# Patient Record
Sex: Female | Born: 1960 | Race: White | Hispanic: No | Marital: Married | Smoking: Never smoker
Health system: Southern US, Community
[De-identification: ages and names within clinical notes are randomized; demographics above are authoritative.]

## PROBLEM LIST (undated history)

## (undated) DIAGNOSIS — K589 Irritable bowel syndrome without diarrhea: Secondary | ICD-10-CM

## (undated) DIAGNOSIS — Z5189 Encounter for other specified aftercare: Secondary | ICD-10-CM

## (undated) DIAGNOSIS — Z9889 Other specified postprocedural states: Secondary | ICD-10-CM

## (undated) DIAGNOSIS — J45909 Unspecified asthma, uncomplicated: Secondary | ICD-10-CM

## (undated) DIAGNOSIS — C8590 Non-Hodgkin lymphoma, unspecified, unspecified site: Secondary | ICD-10-CM

## (undated) DIAGNOSIS — R160 Hepatomegaly, not elsewhere classified: Secondary | ICD-10-CM

## (undated) DIAGNOSIS — E785 Hyperlipidemia, unspecified: Secondary | ICD-10-CM

## (undated) DIAGNOSIS — I10 Essential (primary) hypertension: Secondary | ICD-10-CM

## (undated) DIAGNOSIS — F32A Depression, unspecified: Secondary | ICD-10-CM

## (undated) DIAGNOSIS — K219 Gastro-esophageal reflux disease without esophagitis: Secondary | ICD-10-CM

## (undated) DIAGNOSIS — R112 Nausea with vomiting, unspecified: Secondary | ICD-10-CM

## (undated) DIAGNOSIS — F329 Major depressive disorder, single episode, unspecified: Secondary | ICD-10-CM

## (undated) HISTORY — DX: Non-Hodgkin lymphoma, unspecified, unspecified site: C85.90

## (undated) HISTORY — PX: CARPAL TUNNEL RELEASE: SHX101

## (undated) HISTORY — DX: Gastro-esophageal reflux disease without esophagitis: K21.9

## (undated) HISTORY — DX: Irritable bowel syndrome, unspecified: K58.9

## (undated) HISTORY — DX: Essential (primary) hypertension: I10

## (undated) HISTORY — DX: Hepatomegaly, not elsewhere classified: R16.0

## (undated) HISTORY — DX: Major depressive disorder, single episode, unspecified: F32.9

## (undated) HISTORY — DX: Encounter for other specified aftercare: Z51.89

## (undated) HISTORY — DX: Hyperlipidemia, unspecified: E78.5

## (undated) HISTORY — DX: Unspecified asthma, uncomplicated: J45.909

## (undated) HISTORY — DX: Nausea with vomiting, unspecified: R11.2

## (undated) HISTORY — DX: Other specified postprocedural states: Z98.890

## (undated) HISTORY — DX: Depression, unspecified: F32.A

## (undated) HISTORY — PX: APPENDECTOMY: SHX54

---

## 1984-08-20 HISTORY — PX: LAPAROSCOPIC SALPINGO OOPHERECTOMY: SHX5927

## 1996-08-20 DIAGNOSIS — C8590 Non-Hodgkin lymphoma, unspecified, unspecified site: Secondary | ICD-10-CM

## 1996-08-20 HISTORY — DX: Non-Hodgkin lymphoma, unspecified, unspecified site: C85.90

## 1996-08-20 HISTORY — PX: PORTA CATH INSERTION: CATH118285

## 1997-08-20 HISTORY — PX: BONE MARROW TRANSPLANT: SHX200

## 1997-11-26 ENCOUNTER — Encounter: Admission: RE | Admit: 1997-11-26 | Discharge: 1998-02-24 | Payer: Self-pay | Admitting: Hematology & Oncology

## 1998-03-07 ENCOUNTER — Ambulatory Visit (HOSPITAL_COMMUNITY): Admission: RE | Admit: 1998-03-07 | Discharge: 1998-03-07 | Payer: Self-pay | Admitting: Hematology & Oncology

## 1998-03-08 ENCOUNTER — Ambulatory Visit (HOSPITAL_COMMUNITY): Admission: RE | Admit: 1998-03-08 | Discharge: 1998-03-08 | Payer: Self-pay | Admitting: *Deleted

## 1998-03-08 ENCOUNTER — Ambulatory Visit (HOSPITAL_COMMUNITY): Admission: RE | Admit: 1998-03-08 | Discharge: 1998-03-08 | Payer: Self-pay | Admitting: Hematology & Oncology

## 1998-05-27 ENCOUNTER — Encounter: Payer: Self-pay | Admitting: Hematology & Oncology

## 1998-05-27 ENCOUNTER — Ambulatory Visit (HOSPITAL_COMMUNITY): Admission: RE | Admit: 1998-05-27 | Discharge: 1998-05-27 | Payer: Self-pay | Admitting: Hematology & Oncology

## 1998-08-25 ENCOUNTER — Encounter: Payer: Self-pay | Admitting: Hematology & Oncology

## 1998-08-25 ENCOUNTER — Ambulatory Visit (HOSPITAL_COMMUNITY): Admission: RE | Admit: 1998-08-25 | Discharge: 1998-08-25 | Payer: Self-pay | Admitting: Hematology & Oncology

## 1998-11-23 ENCOUNTER — Encounter: Payer: Self-pay | Admitting: Hematology & Oncology

## 1998-11-23 ENCOUNTER — Ambulatory Visit (HOSPITAL_COMMUNITY): Admission: RE | Admit: 1998-11-23 | Discharge: 1998-11-23 | Payer: Self-pay | Admitting: Hematology & Oncology

## 1999-06-07 ENCOUNTER — Ambulatory Visit (HOSPITAL_COMMUNITY): Admission: RE | Admit: 1999-06-07 | Discharge: 1999-06-07 | Payer: Self-pay | Admitting: Hematology & Oncology

## 1999-06-07 ENCOUNTER — Encounter: Payer: Self-pay | Admitting: Hematology & Oncology

## 1999-11-10 ENCOUNTER — Ambulatory Visit (HOSPITAL_BASED_OUTPATIENT_CLINIC_OR_DEPARTMENT_OTHER): Admission: RE | Admit: 1999-11-10 | Discharge: 1999-11-10 | Payer: Self-pay | Admitting: *Deleted

## 1999-11-27 ENCOUNTER — Ambulatory Visit (HOSPITAL_COMMUNITY): Admission: RE | Admit: 1999-11-27 | Discharge: 1999-11-27 | Payer: Self-pay | Admitting: Hematology & Oncology

## 1999-11-27 ENCOUNTER — Encounter: Payer: Self-pay | Admitting: Hematology & Oncology

## 1999-12-15 ENCOUNTER — Ambulatory Visit (HOSPITAL_BASED_OUTPATIENT_CLINIC_OR_DEPARTMENT_OTHER): Admission: RE | Admit: 1999-12-15 | Discharge: 1999-12-15 | Payer: Self-pay | Admitting: *Deleted

## 2000-04-29 ENCOUNTER — Ambulatory Visit (HOSPITAL_COMMUNITY): Admission: RE | Admit: 2000-04-29 | Discharge: 2000-04-29 | Payer: Self-pay | Admitting: Hematology & Oncology

## 2000-04-29 ENCOUNTER — Encounter: Payer: Self-pay | Admitting: Hematology & Oncology

## 2000-05-21 ENCOUNTER — Other Ambulatory Visit: Admission: RE | Admit: 2000-05-21 | Discharge: 2000-05-21 | Payer: Self-pay | Admitting: Obstetrics and Gynecology

## 2000-06-27 ENCOUNTER — Other Ambulatory Visit: Admission: RE | Admit: 2000-06-27 | Discharge: 2000-06-27 | Payer: Self-pay | Admitting: Obstetrics and Gynecology

## 2000-06-27 ENCOUNTER — Encounter (INDEPENDENT_AMBULATORY_CARE_PROVIDER_SITE_OTHER): Payer: Self-pay

## 2000-07-18 ENCOUNTER — Ambulatory Visit (HOSPITAL_BASED_OUTPATIENT_CLINIC_OR_DEPARTMENT_OTHER): Admission: RE | Admit: 2000-07-18 | Discharge: 2000-07-18 | Payer: Self-pay | Admitting: General Surgery

## 2000-11-28 ENCOUNTER — Ambulatory Visit (HOSPITAL_COMMUNITY): Admission: RE | Admit: 2000-11-28 | Discharge: 2000-11-28 | Payer: Self-pay | Admitting: Hematology & Oncology

## 2000-11-28 ENCOUNTER — Encounter: Payer: Self-pay | Admitting: Hematology & Oncology

## 2001-06-26 ENCOUNTER — Other Ambulatory Visit: Admission: RE | Admit: 2001-06-26 | Discharge: 2001-06-26 | Payer: Self-pay | Admitting: Obstetrics and Gynecology

## 2001-12-11 ENCOUNTER — Encounter: Payer: Self-pay | Admitting: Hematology & Oncology

## 2001-12-11 ENCOUNTER — Ambulatory Visit (HOSPITAL_COMMUNITY): Admission: RE | Admit: 2001-12-11 | Discharge: 2001-12-11 | Payer: Self-pay | Admitting: Hematology & Oncology

## 2002-07-23 ENCOUNTER — Other Ambulatory Visit: Admission: RE | Admit: 2002-07-23 | Discharge: 2002-07-23 | Payer: Self-pay | Admitting: Obstetrics and Gynecology

## 2002-10-07 ENCOUNTER — Encounter: Admission: RE | Admit: 2002-10-07 | Discharge: 2002-10-07 | Payer: Self-pay | Admitting: Family Medicine

## 2002-10-07 ENCOUNTER — Encounter: Payer: Self-pay | Admitting: Family Medicine

## 2002-12-10 ENCOUNTER — Ambulatory Visit (HOSPITAL_COMMUNITY): Admission: RE | Admit: 2002-12-10 | Discharge: 2002-12-10 | Payer: Self-pay | Admitting: Hematology & Oncology

## 2002-12-10 ENCOUNTER — Encounter: Payer: Self-pay | Admitting: Hematology & Oncology

## 2003-10-26 ENCOUNTER — Other Ambulatory Visit: Admission: RE | Admit: 2003-10-26 | Discharge: 2003-10-26 | Payer: Self-pay | Admitting: Obstetrics and Gynecology

## 2005-03-06 ENCOUNTER — Other Ambulatory Visit: Admission: RE | Admit: 2005-03-06 | Discharge: 2005-03-06 | Payer: Self-pay | Admitting: Obstetrics and Gynecology

## 2006-01-31 ENCOUNTER — Ambulatory Visit: Payer: Self-pay | Admitting: Hematology & Oncology

## 2006-02-04 LAB — CBC WITH DIFFERENTIAL/PLATELET
Basophils Absolute: 0 10*3/uL (ref 0.0–0.1)
Eosinophils Absolute: 0.1 10*3/uL (ref 0.0–0.5)
HGB: 13.6 g/dL (ref 11.6–15.9)
MCV: 99.3 fL (ref 81.0–101.0)
MONO%: 7.7 % (ref 0.0–13.0)
NEUT#: 3.5 10*3/uL (ref 1.5–6.5)
RDW: 14 % (ref 11.3–14.5)

## 2006-02-04 LAB — COMPREHENSIVE METABOLIC PANEL
Albumin: 4.3 g/dL (ref 3.5–5.2)
CO2: 29 mEq/L (ref 19–32)
Calcium: 9 mg/dL (ref 8.4–10.5)
Glucose, Bld: 194 mg/dL — ABNORMAL HIGH (ref 70–99)
Potassium: 3.6 mEq/L (ref 3.5–5.3)
Sodium: 141 mEq/L (ref 135–145)
Total Bilirubin: 0.9 mg/dL (ref 0.3–1.2)
Total Protein: 6.9 g/dL (ref 6.0–8.3)

## 2006-10-18 ENCOUNTER — Ambulatory Visit: Payer: Self-pay | Admitting: Family Medicine

## 2006-10-21 ENCOUNTER — Ambulatory Visit: Payer: Self-pay | Admitting: Family Medicine

## 2006-10-24 ENCOUNTER — Encounter: Admission: RE | Admit: 2006-10-24 | Discharge: 2006-10-24 | Payer: Self-pay | Admitting: Family Medicine

## 2007-06-24 ENCOUNTER — Encounter: Admission: RE | Admit: 2007-06-24 | Discharge: 2007-06-24 | Payer: Self-pay | Admitting: Obstetrics and Gynecology

## 2007-08-18 ENCOUNTER — Ambulatory Visit: Payer: Self-pay | Admitting: Family Medicine

## 2009-11-09 LAB — HM DEXA SCAN

## 2011-01-05 NOTE — Op Note (Signed)
[place]. [facility]  Patient:    Rachel Santiago, Rachel Santiago                        MRN: [id number] Proc. Date: 12/15/99 Adm. Date:  [id number] Attending:  Kendell Bane                           Operative Report  PREOPERATIVE DIAGNOSIS:  Left carpal tunnel syndrome.  POSTOPERATIVE DIAGNOSIS:  Left carpal tunnel syndrome.  OPERATION:  Decompression, median nerve, left carpal tunnel.  SURGEON:  Lowell Bouton, M.D.  ANESTHESIA:  Marcaine 0.5%, local with sedation.  OPERATIVE FINDINGS:  The patient had very tight carpal canal with no masses present.  The motor branch of the nerve was intact.  DESCRIPTION OF PROCEDURE:  Under 0.5% Marcaine and local anesthesia, with a tourniquet on the left arm, the left hand was prepped and draped in the usual fashion, and after exsanguinating the limb the tourniquet was inflated to 250 mmHg.  A 2 cm longitudinal incision was made in the palm just ulnar to the thenar crease.  Sharp dissection was carried through the subcutaneous tissues and bleeding points were coagulated.  Blunt dissection was carried through the superficial palmar fascia distal to the transverse carpal ligament.  A hemostat was then placed in the carpal canal up against the hook of the hamate and the transverse carpal ligament was divided sharply on the ulnar border of the median nerve.  The proximal into the ligament was divided with the scissors after bluntly dissecting the nerve away from the undersurface of the ligament.  The nerve was then examined and motor branch was found to be intact.  The carpal canal was explored and no masses were identified.  The wound was irrigated copiously with saline.  The skin was closed with 4-0 nylon sutures.  Sterile dressings were applied followed by a volar wrist splint. The patient tolerated the procedure and went to the recovery room awake and stable in good condition. DD:  12/15/99 TD:   12/16/99 Job: [id number]

## 2011-01-05 NOTE — Op Note (Signed)
[place]. [facility]  Patient:    Rachel Santiago, Rachel Santiago                        MRN: [id number] Proc. Date: 11/10/99 Adm. Date:  [id number] Disc. Date: [id number] Attending:  Kendell Bane Dictator:   Lowell Bouton, M.D.                           Operative Report  PREOPERATIVE DIAGNOSIS:  Right carpal tunnel syndrome.  POSTOPERATIVE DIAGNOSIS:  Right carpal tunnel syndrome.  PROCEDURE:  Decompression of median nerve, right carpal tunnel.  SURGEON:  Dr. Metro Kung.  ANESTHESIA:  Marcaine 0.50% local with sedation.  OPERATING FINDINGS:  The patient had a very narrow carpal canal.  There were no  masses present, and the motor branch was intact.  DESCRIPTION OF PROCEDURE:  Under 0.50% Marcaine local anesthesia with a tourniquet on the right arm, the right hand was prepped and draped in the usual fashion. After exsanguinating the limb the tourniquet was inflated to 250 mmHg.  A 3 cm longitudinal incision was made in the palm just ulnar to the thenar crease. Sharp dissection was carried through the subcutaneous tissues and bleeding points were coagulated.  Blunt dissection was carried through the superficial palmar fascia  distal to the transverse carpal ligament down to the median nerve.  A Hemostat as then placed in the carpal canal up against the hook of the hamate, and the transverse carpal ligament was divided on the ulnar border of the median nerve.  The proximal end of the ligament was divided with the scissors after dissecting the nerve away from the under surface of the ligament.  The carpal canal was then palpated and was found to be adequately decompressed.  The tendons were examined, and there were no masses identified.  The nerve was examined and the motor branch was identified and found to be intact.  The wound was then irrigated with saline. The skin was closed with 4-0 Nylon sutures.  Sterile dressings were  applied, followed by a volar wrist splint.  The patient tolerated the procedure well, and went to the recovery room awake, stable, and in good condition. DD:  11/10/99 TD:  11/12/99 Job: [id number]

## 2011-04-13 ENCOUNTER — Encounter: Payer: Self-pay | Admitting: Family Medicine

## 2011-04-13 ENCOUNTER — Ambulatory Visit (INDEPENDENT_AMBULATORY_CARE_PROVIDER_SITE_OTHER): Payer: 59 | Admitting: Family Medicine

## 2011-04-13 VITALS — BP 100/60 | HR 80 | Wt 200.0 lb

## 2011-04-13 DIAGNOSIS — S93609A Unspecified sprain of unspecified foot, initial encounter: Secondary | ICD-10-CM

## 2011-04-13 NOTE — Progress Notes (Signed)
  Subjective:    Patient ID: Rachel Santiago, female    DOB: July 02, 1961, 50 y.o.   MRN: [id number]  HPI Yesterday while walking down her steps she twisted her right ankle. She complains of pain in the midfoot area and some difficulty with walking.   Review of Systems     Objective:   Physical Exam Exam of the right foot shows no tenderness over the lateral malleolus over head of the fifth metatarsal. ATF is slightly tender. Tender over the mid tarsal area. X-ray is negative.      Assessment & Plan:  Foot sprain.  RICE. Return here if symptoms continue.

## 2011-04-13 NOTE — Patient Instructions (Signed)
Rest, ice, compression and elevation. Advil or Aleve for pain relief. If you're still in pain in about 10 days come back for another x-ray. Also use hard soled shoe

## 2011-04-26 ENCOUNTER — Ambulatory Visit (INDEPENDENT_AMBULATORY_CARE_PROVIDER_SITE_OTHER): Payer: 59 | Admitting: Family Medicine

## 2011-04-26 ENCOUNTER — Encounter: Payer: Self-pay | Admitting: Family Medicine

## 2011-04-26 VITALS — BP 102/70 | HR 60 | Ht 65.5 in | Wt 196.0 lb

## 2011-04-26 DIAGNOSIS — B029 Zoster without complications: Secondary | ICD-10-CM

## 2011-04-26 MED ORDER — VALACYCLOVIR HCL 1 G PO TABS: 1000.0000 mg | ORAL_TABLET | Freq: Three times a day (TID) | ORAL | Status: AC

## 2011-04-26 NOTE — Progress Notes (Signed)
  Subjective:    Patient ID: Rachel Santiago, female    DOB: [birth year], 50 y.o.   MRN: [id number]  HPI She first noticed a rash on her right torso Sunday. The rash has not changed since Monday. The symptoms go from mid back to the mid abdominal area.   Review of Systems     Objective:   Physical Exam She has 2 erythematous vesicular areas on the right torso in the T8/9 area       Assessment & Plan:   1. Shingles    I will place her on a Zostavax. Also recommend she use ibuprofen for pain and call me if she needs something stronger.

## 2011-04-26 NOTE — Patient Instructions (Addendum)
Use 800 mg of ibuprofen 3 times a day. I will also call in a Zostavax. You need to avoid small children and pregnant women

## 2011-08-22 ENCOUNTER — Ambulatory Visit (INDEPENDENT_AMBULATORY_CARE_PROVIDER_SITE_OTHER): Payer: 59 | Admitting: Family Medicine

## 2011-08-22 ENCOUNTER — Encounter: Payer: Self-pay | Admitting: Internal Medicine

## 2011-08-22 ENCOUNTER — Encounter: Payer: Self-pay | Admitting: Family Medicine

## 2011-08-22 VITALS — BP 114/70 | HR 101 | Temp 99.0°F | Wt 199.0 lb

## 2011-08-22 DIAGNOSIS — J019 Acute sinusitis, unspecified: Secondary | ICD-10-CM

## 2011-08-22 MED ORDER — CLARITHROMYCIN ER 500 MG PO TB24: 1000.0000 mg | ORAL_TABLET | Freq: Every day | ORAL | Status: AC

## 2011-08-22 NOTE — Patient Instructions (Signed)
Take all the antibiotic and call me if not totally back to normal 

## 2011-08-22 NOTE — Progress Notes (Signed)
  Subjective:    Patient ID: Rachel Santiago, female    DOB: March 12, 1961, 51 y.o.   MRN: [id number]  HPI Approximately one week ago she developed sinus congestion, dry cough that has progressed to more productive cough, postnasal drainage and she is now experiencing left upper toothache. No fever, chills, sore throat or earache. She does not smoke but does have underlying allergies. She also has had some marital difficulties however through counseling these have apparently been resolved   Review of Systems     Objective:   Physical Exam alert and in no distress. Tympanic membranes and canals are normal. Throat is clear. Tonsils are normal. Neck is supple without adenopathy or thyromegaly. Cardiac exam shows a regular sinus rhythm without murmurs or gallops. Lungs are clear to auscultation. Nasal mucosa is normal with tenderness over left maxillary sinus.         Assessment & Plan:  Sinusitis. Erythromycin. She will call if not entirely better We had a good discussion about her marital difficulties and how she is handling this. She has a very positive attitude towards this and apparently this has been resolved. She through all of this has learned she needs to take better care of herself.

## 2011-08-23 ENCOUNTER — Telehealth: Payer: Self-pay | Admitting: Family Medicine

## 2011-08-23 MED ORDER — SULFAMETHOXAZOLE-TRIMETHOPRIM 800-160 MG PO TABS: 1.0000 | ORAL_TABLET | Freq: Two times a day (BID) | ORAL | Status: AC

## 2011-08-23 NOTE — Telephone Encounter (Signed)
Patient would like to be switched to a different medication. I will call in Septra

## 2011-08-23 NOTE — Telephone Encounter (Signed)
Pt informed

## 2011-08-23 NOTE — Telephone Encounter (Signed)
Called pt she still request wants a different antibiotic she said something about genetics just still wants different antibiotic and to call her back and let her know

## 2011-08-23 NOTE — Telephone Encounter (Signed)
Let her know that I called in a different medication.

## 2012-01-08 ENCOUNTER — Ambulatory Visit (INDEPENDENT_AMBULATORY_CARE_PROVIDER_SITE_OTHER): Payer: 59 | Admitting: Family Medicine

## 2012-01-08 ENCOUNTER — Encounter: Payer: Self-pay | Admitting: Family Medicine

## 2012-01-08 VITALS — Wt 202.0 lb

## 2012-01-08 DIAGNOSIS — L237 Allergic contact dermatitis due to plants, except food: Secondary | ICD-10-CM

## 2012-01-08 DIAGNOSIS — L255 Unspecified contact dermatitis due to plants, except food: Secondary | ICD-10-CM

## 2012-01-08 MED ORDER — TRIAMCINOLONE ACETONIDE 0.5 % EX CREA: TOPICAL_CREAM | Freq: Three times a day (TID) | CUTANEOUS | Status: DC

## 2012-01-08 NOTE — Progress Notes (Signed)
  Subjective:    Patient ID: Rachel Santiago, female    DOB: 01-23-1961, 51 y.o.   MRN: [id number]  HPI She was working in the yard over the weekend and developed a pruritic linear rash. She th 1. Contact dermatitis due to poison ivy   s     Objective:   Physical Exam Multiple erythematous vesicular lesions are noted on her arms and one on her right knee. She also has some on the inner aspect of the thighs.      Assessment & Plan:   1. Contact dermatitis due to poison ivy    I explained to her how she contacted the poison ivy and proper care of this including cold compresses, steroid cream which I did call in. Also recommend antihistamine especially Benadryl at night. She will call if they problem worsens.

## 2012-01-08 NOTE — Patient Instructions (Signed)
Cold rag [place] areas it really gets a lot as well as using the steroid cream. Use Benadryl at night and Claritin or Allegra during the day. You don't need to take Zyrtec as well as all the other antihistamines.

## 2012-03-21 ENCOUNTER — Other Ambulatory Visit: Payer: Self-pay | Admitting: Endocrinology

## 2012-03-21 DIAGNOSIS — N058 Unspecified nephritic syndrome with other morphologic changes: Secondary | ICD-10-CM

## 2012-03-21 DIAGNOSIS — R7989 Other specified abnormal findings of blood chemistry: Secondary | ICD-10-CM

## 2012-03-25 ENCOUNTER — Ambulatory Visit
Admission: RE | Admit: 2012-03-25 | Discharge: 2012-03-25 | Disposition: A | Discharge status: Home / Self Care | Payer: 59 | Source: Ambulatory Visit | Attending: Endocrinology | Admitting: Endocrinology

## 2012-03-25 DIAGNOSIS — N058 Unspecified nephritic syndrome with other morphologic changes: Secondary | ICD-10-CM

## 2012-03-25 DIAGNOSIS — R7989 Other specified abnormal findings of blood chemistry: Secondary | ICD-10-CM

## 2012-03-25 MED ORDER — IOHEXOL 300 MG/ML  SOLN
100.0000 mL | Freq: Once | INTRAMUSCULAR | Status: AC | PRN
  Administered 2012-03-25: 100 mL via INTRAVENOUS

## 2012-03-25 MED ORDER — IOHEXOL 300 MG/ML  SOLN: 40.0000 mL | Freq: Once | INTRAMUSCULAR | Status: AC | PRN

## 2012-04-08 ENCOUNTER — Ambulatory Visit (INDEPENDENT_AMBULATORY_CARE_PROVIDER_SITE_OTHER): Payer: 59 | Admitting: Family Medicine

## 2012-04-08 VITALS — BP 120/80 | HR 70 | Wt 209.0 lb

## 2012-04-08 DIAGNOSIS — B356 Tinea cruris: Secondary | ICD-10-CM

## 2012-04-08 DIAGNOSIS — E119 Type 2 diabetes mellitus without complications: Secondary | ICD-10-CM

## 2012-04-08 DIAGNOSIS — L739 Follicular disorder, unspecified: Secondary | ICD-10-CM

## 2012-04-08 DIAGNOSIS — Z87898 Personal history of other specified conditions: Secondary | ICD-10-CM

## 2012-04-08 DIAGNOSIS — L738 Other specified follicular disorders: Secondary | ICD-10-CM

## 2012-04-08 DIAGNOSIS — Z8572 Personal history of non-Hodgkin lymphomas: Secondary | ICD-10-CM

## 2012-04-08 MED ORDER — DOXYCYCLINE HYCLATE 100 MG PO TABS
100.0000 mg | ORAL_TABLET | Freq: Two times a day (BID) | ORAL | Status: DC
Start: 2012-04-08 — End: 2012-04-18

## 2012-04-08 MED ORDER — FLUCONAZOLE 100 MG PO TABS: 100.0000 mg | ORAL_TABLET | Freq: Every day | ORAL | Status: AC

## 2012-04-08 NOTE — Progress Notes (Signed)
  Subjective:    Patient ID: Rachel Santiago, female    DOB: 11/12/1960, 51 y.o.   MRN: [id number]  HPI She has a several month history of inguinal and groin rash. She has been using various over-the-counter medications including antifungal and antibiotic as well as steroid preparations without success. She does have underlying diabetes and is followed by Dr. Adria Devon for this. She also has a previous history of non-Hodgkin's lymphoma. She has no lesions or complaints in other areas of her body.   Review of Systems     Objective:   Physical Exam Erythematous area noted in the crease of her legs and also erythema with follicular lesions noted proximal to this and in the pubic area.      Assessment & Plan:   1. Tinea cruris  fluconazole (DIFLUCAN) 100 MG tablet  2. Folliculitis  doxycycline (VIBRA-TABS) 100 MG tablet  3. Diabetes mellitus type 2, insulin dependent    4. History of non-Hodgkin's lymphoma     she is to call me in one week and let he know how she's doing on his present regimen.

## 2012-04-08 NOTE — Patient Instructions (Addendum)
Take the medicines for the next week at least. Call me then and let me know how you're doing

## 2012-04-17 ENCOUNTER — Telehealth: Payer: Self-pay | Admitting: Family Medicine

## 2012-04-17 DIAGNOSIS — L739 Follicular disorder, unspecified: Secondary | ICD-10-CM

## 2012-04-17 NOTE — Telephone Encounter (Signed)
Pt called and stated that she was to call this week with update. She says that on the "rash scale" if she was a 9.5 when she saw you she is now a 3.5. She states that she is much better. States that she has finished one med and will be out of the other tomorrow. Pt uses [facility] on [address].

## 2012-04-18 MED ORDER — DOXYCYCLINE HYCLATE 100 MG PO TABS: 100.0000 mg | ORAL_TABLET | Freq: Two times a day (BID) | ORAL | Status: AC

## 2012-04-18 MED ORDER — FLUCONAZOLE 100 MG PO TABS: 100.0000 mg | ORAL_TABLET | Freq: Every day | ORAL | Status: AC

## 2012-04-18 NOTE — Telephone Encounter (Signed)
Let her know that I called in the meds

## 2012-04-18 NOTE — Telephone Encounter (Signed)
Doxycycline and diflucan were renewed

## 2012-04-22 NOTE — Telephone Encounter (Signed)
Pt notified that meds was sent in

## 2012-06-10 ENCOUNTER — Encounter: Payer: Self-pay | Admitting: Gastroenterology

## 2012-07-02 ENCOUNTER — Encounter: Payer: Self-pay | Admitting: Gastroenterology

## 2012-07-02 ENCOUNTER — Ambulatory Visit (INDEPENDENT_AMBULATORY_CARE_PROVIDER_SITE_OTHER): Payer: 59 | Admitting: Gastroenterology

## 2012-07-02 ENCOUNTER — Other Ambulatory Visit (INDEPENDENT_AMBULATORY_CARE_PROVIDER_SITE_OTHER): Payer: 59

## 2012-07-02 VITALS — BP 134/72 | HR 80 | Ht 65.5 in | Wt 203.0 lb

## 2012-07-02 DIAGNOSIS — R7401 Elevation of levels of liver transaminase levels: Secondary | ICD-10-CM

## 2012-07-02 DIAGNOSIS — R7402 Elevation of levels of lactic acid dehydrogenase (LDH): Secondary | ICD-10-CM

## 2012-07-02 DIAGNOSIS — Z1211 Encounter for screening for malignant neoplasm of colon: Secondary | ICD-10-CM

## 2012-07-02 DIAGNOSIS — R160 Hepatomegaly, not elsewhere classified: Secondary | ICD-10-CM

## 2012-07-02 LAB — HEPATITIS B SURFACE ANTIBODY,QUALITATIVE: Hep B S Ab: NEGATIVE

## 2012-07-02 LAB — HEPATIC FUNCTION PANEL
AST: 95 U/L — ABNORMAL HIGH (ref 0–37)
Alkaline Phosphatase: 66 U/L (ref 39–117)
Bilirubin, Direct: 0.2 mg/dL (ref 0.0–0.3)

## 2012-07-02 NOTE — Progress Notes (Signed)
History of Present Illness: This is a 51 year old female recently found to have hepatomegaly on CT scan. Her liver measured 22.9 cm in craniocaudal length without focal lesion. In addition she has mildly elevated liver function tests with an AST of 105 and ALT at 73. Iron studies were normal except for a ferritin that was mildly elevated at 453. She has a history of non-Hodgkin's lymphoma diagnosed in 1998 treated with chemotherapy. She underwent stem cell transplant in 1999. She has been disease-free. She has not had regular oncology followup since 2004 when she was released from followup. She has no gastrointestinal complaints except occasional episodes of loose stools which have been present for many years. She is not previously at colonoscopy. She relates no prior history of liver problems except for mildly elevated LFTs on several occasions in the past. Denies weight loss, abdominal pain, constipation, change in stool caliber, melena, hematochezia, nausea, vomiting, dysphagia, reflux symptoms, chest pain.   Allergies  Allergen Reactions  . Amoxicillin   . Penicillins    Outpatient Prescriptions Prior to Visit  Medication Sig Dispense Refill  . benazepril (LOTENSIN) 20 MG tablet Take 10 mg by mouth daily.       . DULoxetine (CYMBALTA) 20 MG capsule Take 30 mg by mouth daily.       . insulin detemir (LEVEMIR) 100 UNIT/ML injection Inject 60 Units into the skin. 10 units am 50 units pm      . pramlintide (SYMLIN) 600 MCG/ML injection Inject 120 mcg into the skin 3 (three) times daily before meals.        . [DISCONTINUED] cetirizine (ZYRTEC) 10 MG tablet Take 10 mg by mouth daily.        . [DISCONTINUED] Choline Fenofibrate 45 MG capsule Take 45 mg by mouth daily.       . [DISCONTINUED] simvastatin (ZOCOR) 20 MG tablet Take 20 mg by mouth every evening.      . [DISCONTINUED] triamcinolone cream (KENALOG) 0.5 % Apply topically 3 (three) times daily.  30 g  1   Last reviewed on 07/02/2012  8:59 AM  by Meryl Dare, MD,FACG Past Medical History  Diagnosis Date  . Allergic rhinitis   . Diabetes mellitus   . Non-Hodgkin's lymphoma 1998  . Hyperlipidemia   . Asthma   . Hypertension   . GERD (gastroesophageal reflux disease)   . Hepatomegaly   . Depression   . IBS (irritable bowel syndrome)    Past Surgical History  Procedure Date  . Bone marrow transplant 1999  . Laparoscopic salpingo oopherectomy 1986  . Cesarean section   . Appendectomy    History   Social History  . Marital Status: Married    Spouse Name: N/A    Number of Children: 1  . Years of Education: N/A   Occupational History  . Land   Social History Main Topics  . Smoking status: Never Smoker   . Smokeless tobacco: Never Used  . Alcohol Use: No  . Drug Use: No  . Sexually Active: None   Other Topics Concern  . None   Social History Narrative   Daily caffeine    Family History  Problem Relation Age of Onset  . Diabetes Mellitus II Mother   . Pneumonia Mother   . CAD Father   . Colon polyps Mother   . Colon polyps Father   . Colon cancer Neg Hx   . Irritable bowel syndrome Mother     Review of Systems:  Pertinent positive and negative review of systems were noted in the above HPI section. All other review of systems were otherwise negative.  Physical Exam: General: Well developed , well nourished, no acute distress Head: Normocephalic and atraumatic Eyes:  sclerae anicteric, EOMI Ears: Normal auditory acuity Mouth: No deformity or lesions Neck: Supple, no masses or thyromegaly Lungs: Clear throughout to auscultation Heart: Regular rate and rhythm; no murmurs, rubs or bruits Abdomen: Soft, non tender and non distended. The liver is enlarged and palpable about 3 fingerbreadths below the costal margin. Liver size is enlarged by percussion and scratch testing. No masses, splenomegaly or hernias noted. Normal Bowel sounds Musculoskeletal: Symmetrical with no gross  deformities  Skin: No lesions on visible extremities Pulses:  Normal pulses noted Extremities: No clubbing, cyanosis, edema or deformities noted Neurological: Alert oriented x 4, grossly nonfocal Cervical Nodes:  No significant cervical adenopathy Inguinal Nodes: No significant inguinal adenopathy Psychological:  Alert and cooperative. Normal mood and affect  Assessment and Recommendations:  1. Hepatomegaly and mildly elevated transaminases. There is a broad differential for hepatomegaly including hepatitis, storage diseases, infiltrative diseases, impaired hepatic venous flow and biliary diseases. In this clinical situation infiltrative diseases are a concern as well as venoocclusive disease related to therapy for non-Hodgkin's lymphoma. Schedule echocardiogram and obtain all standard serologies for viral and metabolic liver diseases. Consider liver biopsy for further evaluation.  2. Episodic loose stools probably related to known irritable bowel syndrome.  3. Colorectal cancer screening, routine risk. She understands she is due for screening colonoscopy but would like to defer this until her liver evaluation has been completed and she is financially ready to proceed with colonoscopy. The risks, benefits, and alternatives to colonoscopy with possible biopsy and possible polypectomy were discussed with the patient.

## 2012-07-02 NOTE — Patient Instructions (Addendum)
Your physician has requested that you go to the basement for  lab work before leaving today.  You will be due for a recall colonoscopy in 09/2012. We will send you a reminder in the mail when it gets closer to that time.  cc: Adrian Prince, MD

## 2012-07-03 ENCOUNTER — Other Ambulatory Visit: Payer: Self-pay | Admitting: Gastroenterology

## 2012-07-03 DIAGNOSIS — R160 Hepatomegaly, not elsewhere classified: Secondary | ICD-10-CM

## 2012-07-03 LAB — MITOCHONDRIAL ANTIBODIES: Mitochondrial M2 Ab, IgG: 0.24 (ref <0.91)

## 2012-07-03 LAB — ANA: Anti Nuclear Antibody(ANA): NEGATIVE

## 2012-07-03 NOTE — Progress Notes (Signed)
Spoke with patient notified her of appt date and time of Echocardiogram on 07/22/12 at 11:00 arriving 10:45am at [facility]. Pt agreed and verbalized understanding.

## 2012-07-04 LAB — CERULOPLASMIN: Ceruloplasmin: 30 mg/dL (ref 20–60)

## 2012-07-04 LAB — ALPHA-1-ANTITRYPSIN: A-1 Antitrypsin, Ser: 102 mg/dL (ref 90–200)

## 2012-07-04 LAB — ANTI-SMOOTH MUSCLE ANTIBODY, IGG: Smooth Muscle Ab: 7 U (ref <20)

## 2012-07-22 ENCOUNTER — Ambulatory Visit (HOSPITAL_COMMUNITY)
Admission: RE | Admit: 2012-07-22 | Discharge: 2012-07-22 | Disposition: A | Discharge status: Home / Self Care | Payer: 59 | Source: Ambulatory Visit | Attending: Gastroenterology | Admitting: Gastroenterology

## 2012-07-22 DIAGNOSIS — R011 Cardiac murmur, unspecified: Secondary | ICD-10-CM | POA: Insufficient documentation

## 2012-07-22 DIAGNOSIS — R160 Hepatomegaly, not elsewhere classified: Secondary | ICD-10-CM | POA: Insufficient documentation

## 2012-07-22 DIAGNOSIS — Z8572 Personal history of non-Hodgkin lymphomas: Secondary | ICD-10-CM

## 2012-07-22 DIAGNOSIS — E785 Hyperlipidemia, unspecified: Secondary | ICD-10-CM | POA: Insufficient documentation

## 2012-07-22 DIAGNOSIS — E119 Type 2 diabetes mellitus without complications: Secondary | ICD-10-CM

## 2012-07-22 DIAGNOSIS — Z8249 Family history of ischemic heart disease and other diseases of the circulatory system: Secondary | ICD-10-CM | POA: Insufficient documentation

## 2012-07-22 DIAGNOSIS — I10 Essential (primary) hypertension: Secondary | ICD-10-CM | POA: Insufficient documentation

## 2012-07-22 DIAGNOSIS — Z794 Long term (current) use of insulin: Secondary | ICD-10-CM

## 2012-07-22 DIAGNOSIS — C8589 Other specified types of non-Hodgkin lymphoma, extranodal and solid organ sites: Secondary | ICD-10-CM | POA: Insufficient documentation

## 2012-07-22 NOTE — Progress Notes (Signed)
  Echocardiogram 2D Echocardiogram has been performed.  Georgian Co 07/22/2012, 11:54 AM

## 2012-07-23 ENCOUNTER — Other Ambulatory Visit: Payer: Self-pay

## 2012-07-23 ENCOUNTER — Other Ambulatory Visit: Payer: Self-pay | Admitting: Radiology

## 2012-07-23 DIAGNOSIS — R160 Hepatomegaly, not elsewhere classified: Secondary | ICD-10-CM

## 2012-07-23 DIAGNOSIS — R7401 Elevation of levels of liver transaminase levels: Secondary | ICD-10-CM

## 2012-07-24 ENCOUNTER — Encounter (HOSPITAL_COMMUNITY): Payer: Self-pay

## 2012-07-24 ENCOUNTER — Ambulatory Visit (HOSPITAL_COMMUNITY)
Admission: RE | Admit: 2012-07-24 | Discharge: 2012-07-24 | Disposition: A | Discharge status: Home / Self Care | Payer: 59 | Source: Ambulatory Visit | Attending: Gastroenterology | Admitting: Gastroenterology

## 2012-07-24 ENCOUNTER — Inpatient Hospital Stay (HOSPITAL_COMMUNITY)
Admission: RE | Admit: 2012-07-24 | Discharge: 2012-07-24 | Disposition: A | Discharge status: Home / Self Care | Payer: 59 | Source: Ambulatory Visit

## 2012-07-24 ENCOUNTER — Other Ambulatory Visit: Payer: Self-pay | Admitting: Radiology

## 2012-07-24 DIAGNOSIS — R7402 Elevation of levels of lactic acid dehydrogenase (LDH): Secondary | ICD-10-CM | POA: Insufficient documentation

## 2012-07-24 DIAGNOSIS — R7401 Elevation of levels of liver transaminase levels: Secondary | ICD-10-CM | POA: Insufficient documentation

## 2012-07-24 DIAGNOSIS — R160 Hepatomegaly, not elsewhere classified: Secondary | ICD-10-CM | POA: Insufficient documentation

## 2012-07-24 LAB — CBC
HCT: 33.6 % — ABNORMAL LOW (ref 36.0–46.0)
Hemoglobin: 11.5 g/dL — ABNORMAL LOW (ref 12.0–15.0)
MCH: 32.9 pg (ref 26.0–34.0)
MCHC: 34.2 g/dL (ref 30.0–36.0)
MCV: 96 fL (ref 78.0–100.0)
RDW: 13.1 % (ref 11.5–15.5)

## 2012-07-24 LAB — GLUCOSE, CAPILLARY: Glucose-Capillary: 180 mg/dL — ABNORMAL HIGH (ref 70–99)

## 2012-07-24 LAB — PROTIME-INR: INR: 0.91 (ref 0.00–1.49)

## 2012-07-24 MED ORDER — FENTANYL CITRATE 0.05 MG/ML IJ SOLN
INTRAMUSCULAR | Status: AC
  Filled 2012-07-24: qty 4

## 2012-07-24 MED ORDER — HYDROCODONE-ACETAMINOPHEN 5-325 MG PO TABS
1.0000 | ORAL_TABLET | ORAL | Status: DC | PRN
  Filled 2012-07-24: qty 2

## 2012-07-24 MED ORDER — MIDAZOLAM HCL 2 MG/2ML IJ SOLN
INTRAMUSCULAR | Status: AC | PRN
  Administered 2012-07-24: 1 mg via INTRAVENOUS
  Administered 2012-07-24: 2 mg via INTRAVENOUS
  Administered 2012-07-24: 1 mg via INTRAVENOUS

## 2012-07-24 MED ORDER — MIDAZOLAM HCL 2 MG/2ML IJ SOLN
INTRAMUSCULAR | Status: AC
  Filled 2012-07-24: qty 4

## 2012-07-24 MED ORDER — SODIUM CHLORIDE 0.9 % IV SOLN
INTRAVENOUS | Status: DC
  Administered 2012-07-24: 09:00:00 via INTRAVENOUS

## 2012-07-24 MED ORDER — FENTANYL CITRATE 0.05 MG/ML IJ SOLN
INTRAMUSCULAR | Status: AC | PRN
  Administered 2012-07-24 (×2): 50 ug via INTRAVENOUS

## 2012-07-24 NOTE — H&P (Signed)
Chief Complaint: "I'm here for a liver biopsy" Referring Physician:Stark HPI: Rachel Santiago is an 51 y.o. female with findings of hepatomegaly and elevated LFTs of uncertain etiology at this point. She is referred for random liver biopsy. PMHx and meds reviewed.  Past Medical History:  Past Medical History  Diagnosis Date  . Allergic rhinitis   . Diabetes mellitus   . Non-Hodgkin's lymphoma 1998  . Hyperlipidemia   . Asthma   . Hypertension   . GERD (gastroesophageal reflux disease)   . Hepatomegaly   . Depression   . IBS (irritable bowel syndrome)     Past Surgical History:  Past Surgical History  Procedure Date  . Bone marrow transplant 1999  . Laparoscopic salpingo oopherectomy 1986  . Cesarean section   . Appendectomy     Family History:  Family History  Problem Relation Age of Onset  . Diabetes Mellitus II Mother   . Pneumonia Mother   . CAD Father   . Colon polyps Mother   . Colon polyps Father   . Colon cancer Neg Hx   . Irritable bowel syndrome Mother     Social History:  reports that she has never smoked. She has never used smokeless tobacco. She reports that she does not drink alcohol or use illicit drugs.  Allergies:  Allergies  Allergen Reactions  . Amoxicillin   . Penicillins     Medications: Outpatient Prescriptions Prior to Visit   Medication  Sig  Dispense  Refill   .  benazepril (LOTENSIN) 20 MG tablet  Take 10 mg by mouth daily.     .  DULoxetine (CYMBALTA) 20 MG capsule  Take 30 mg by mouth daily.     .  insulin detemir (LEVEMIR) 100 UNIT/ML injection  Inject 60 Units into the skin. 10 units am 50 units pm     .  pramlintide (SYMLIN) 600 MCG/ML injection  Inject 120 mcg into the skin 3 (three) times daily before meals.        Please HPI for pertinent positives, otherwise complete 10 system ROS negative.  Physical Exam: Blood pressure 135/79, pulse 99, temperature 98.5 F (36.9 C), temperature source Oral, resp. rate 18, height 5' 5.5"  (1.664 m), weight 200 lb (90.719 kg), SpO2 99.00%. Body mass index is 32.78 kg/(m^2).   General Appearance:  Alert, cooperative, no distress, appears stated age  Head:  Normocephalic, without obvious abnormality, atraumatic  ENT: Unremarkable  Neck: Supple, symmetrical, trachea midline, no adenopathy, thyroid: not enlarged, symmetric, no tenderness/mass/nodules  Lungs:   Clear to auscultation bilaterally, no w/r/r, respirations unlabored without use of accessory muscles.  Heart:  Regular rate and rhythm, S1, S2 normal, no murmur, rub or gallop. Carotids 2+ without bruit.  Abdomen:   Soft, non-tender, non distended. Bowel sounds active all four quadrants,  no masses, no organomegaly.  Neurologic: Normal affect, no gross deficits.   No results found for this or any previous visit (from the past 48 hour(s)). No results found.  Assessment/Plan Elevated liver enzymes and hepatomegaly Discussed US guided liver biopsy, including risks and complications Labs pending Consent signed in chart  Brayton El PA-C 07/24/2012, 9:05 AM

## 2012-07-24 NOTE — Procedures (Signed)
US guided liver biopsy.  3 cores obtained.  8 mm hypoechoic lesion identified during procedure, discussed with Dr. Russella Dar and recommend follow up MRI to evaluate.

## 2012-07-25 ENCOUNTER — Other Ambulatory Visit: Payer: Self-pay

## 2012-07-25 DIAGNOSIS — K769 Liver disease, unspecified: Secondary | ICD-10-CM

## 2012-07-29 ENCOUNTER — Ambulatory Visit
Admission: RE | Admit: 2012-07-29 | Discharge: 2012-07-29 | Disposition: A | Discharge status: Home / Self Care | Payer: 59 | Source: Ambulatory Visit | Attending: Gastroenterology | Admitting: Gastroenterology

## 2012-07-29 DIAGNOSIS — K769 Liver disease, unspecified: Secondary | ICD-10-CM

## 2012-07-29 MED ORDER — GADOBENATE DIMEGLUMINE 529 MG/ML IV SOLN
10.0000 mL | Freq: Once | INTRAVENOUS | Status: AC | PRN
  Administered 2012-07-29: 10 mL via INTRAVENOUS

## 2012-09-30 ENCOUNTER — Encounter: Payer: Self-pay | Admitting: Gastroenterology

## 2013-01-08 ENCOUNTER — Telehealth: Payer: Self-pay | Admitting: Family Medicine

## 2013-01-08 ENCOUNTER — Ambulatory Visit (INDEPENDENT_AMBULATORY_CARE_PROVIDER_SITE_OTHER): Payer: 59 | Admitting: Family Medicine

## 2013-01-08 ENCOUNTER — Encounter: Payer: Self-pay | Admitting: Family Medicine

## 2013-01-08 VITALS — BP 114/64 | HR 82 | Temp 98.8°F | Wt 202.0 lb

## 2013-01-08 DIAGNOSIS — J309 Allergic rhinitis, unspecified: Secondary | ICD-10-CM

## 2013-01-08 DIAGNOSIS — J209 Acute bronchitis, unspecified: Secondary | ICD-10-CM

## 2013-01-08 MED ORDER — CLARITHROMYCIN 500 MG PO TABS: 500.0000 mg | ORAL_TABLET | Freq: Two times a day (BID) | ORAL | Status: DC

## 2013-01-08 NOTE — Progress Notes (Signed)
  Subjective:    Patient ID: Rachel Santiago, female    DOB: [birth year], 52 y.o.   MRN: [id number]  HPI She has a one-week history of nasal congestion but no sore throat, fever, chills or earache. 4 days ago she developed a productive cough but again no fever or chills. She does have under ring time allergies with itchy watery eyes and nasal congestion and she is taking Zyrtec and Flonase regularly. She does not smoke.   Review of Systems     Objective:   Physical Exam alert and in no distress. Tympanic membranes and canals are normal. Throat is clear. Tonsils are normal. Neck is supple without adenopathy or thyromegaly. Cardiac exam shows a regular sinus rhythm without murmurs or gallops. Lungs are clear to auscultation.as the mucosa is normal with nontender sinuses        Assessment & Plan:  Chronic allergic rhinitis  Acute bronchitis - Plan: clarithromycin (BIAXIN) 500 MG tablet continue on allergy medications.

## 2013-01-08 NOTE — Telephone Encounter (Signed)
ERR

## 2013-01-08 NOTE — Patient Instructions (Signed)
Take all the antibiotic and call me if not entirely better when you finish.

## 2013-05-04 ENCOUNTER — Telehealth: Payer: Self-pay | Admitting: Family Medicine

## 2013-05-04 NOTE — Telephone Encounter (Signed)
lm

## 2013-05-28 ENCOUNTER — Encounter: Payer: Self-pay | Admitting: Gastroenterology

## 2013-06-22 ENCOUNTER — Telehealth: Payer: Self-pay | Admitting: Family Medicine

## 2013-06-22 NOTE — Telephone Encounter (Signed)
Robitussin-DM during the day and NyQuil at night 

## 2013-06-22 NOTE — Telephone Encounter (Signed)
PT INFORMED WORD FOR WORD AND VERBALIZED UNDERSTANDING  

## 2013-06-24 ENCOUNTER — Ambulatory Visit (INDEPENDENT_AMBULATORY_CARE_PROVIDER_SITE_OTHER): Payer: 59 | Admitting: Family Medicine

## 2013-06-24 ENCOUNTER — Encounter: Payer: Self-pay | Admitting: Family Medicine

## 2013-06-24 VITALS — BP 122/80 | HR 91 | Temp 98.7°F | Wt 194.0 lb

## 2013-06-24 DIAGNOSIS — J45901 Unspecified asthma with (acute) exacerbation: Secondary | ICD-10-CM

## 2013-06-24 DIAGNOSIS — J309 Allergic rhinitis, unspecified: Secondary | ICD-10-CM | POA: Insufficient documentation

## 2013-06-24 DIAGNOSIS — J069 Acute upper respiratory infection, unspecified: Secondary | ICD-10-CM

## 2013-06-24 MED ORDER — LEVALBUTEROL TARTRATE 45 MCG/ACT IN AERO: 2.0000 | INHALATION_SPRAY | RESPIRATORY_TRACT | Status: DC | PRN

## 2013-06-24 NOTE — Progress Notes (Signed)
  Subjective:    Patient ID: Rachel Santiago, female    DOB: 09/21/1960, 52 y.o.   MRN: [id number]  HPI A six-day history of rhinorrhea and nasal congestion followed by a cough that has been progressively getting worse and is associated with wheezing . No fever, chills, sore throat or earache. She has had difficulty with wheezing in the past and found regular albuterol to make her too nervous. Xopenex worksetter. She does have underlying allergies and continues on Zyrtec and Flonase   Review of Systems     Objective:   Physical Exam alert and in no distress. Tympanic membranes and canals are normal. Throat is clear. Tonsils are normal. Neck is supple without adenopathy or thyromegaly. Cardiac exam shows a regular sinus rhythm without murmurs or gallops. Lungs show wheezing on expiration       Assessment & Plan:  Acute URI - Plan: levalbuterol (XOPENEX HFA) 45 MCG/ACT inhaler  Asthma exacerbation, mild - Plan: levalbuterol (XOPENEX HFA) 45 MCG/ACT inhaler  Allergic rhinitis due to allergen   She will call if continued difficulty. Discussed the fact that this is most likely viral and an antibiotic would not be helpful . She can also use OTC cough meds.

## 2013-06-25 ENCOUNTER — Other Ambulatory Visit: Payer: Self-pay

## 2013-09-21 ENCOUNTER — Encounter: Payer: Self-pay | Admitting: Family Medicine

## 2013-09-21 ENCOUNTER — Ambulatory Visit (INDEPENDENT_AMBULATORY_CARE_PROVIDER_SITE_OTHER): Payer: 59 | Admitting: Family Medicine

## 2013-09-21 VITALS — BP 122/70 | HR 107 | Temp 99.9°F | Wt 201.0 lb

## 2013-09-21 DIAGNOSIS — Z794 Long term (current) use of insulin: Secondary | ICD-10-CM

## 2013-09-21 DIAGNOSIS — J45901 Unspecified asthma with (acute) exacerbation: Secondary | ICD-10-CM

## 2013-09-21 DIAGNOSIS — J45909 Unspecified asthma, uncomplicated: Secondary | ICD-10-CM

## 2013-09-21 DIAGNOSIS — E119 Type 2 diabetes mellitus without complications: Secondary | ICD-10-CM

## 2013-09-21 DIAGNOSIS — J309 Allergic rhinitis, unspecified: Secondary | ICD-10-CM

## 2013-09-21 DIAGNOSIS — J069 Acute upper respiratory infection, unspecified: Secondary | ICD-10-CM

## 2013-09-21 MED ORDER — LEVALBUTEROL TARTRATE 45 MCG/ACT IN AERO: 2.0000 | INHALATION_SPRAY | RESPIRATORY_TRACT | Status: AC | PRN

## 2013-09-21 MED ORDER — BUDESONIDE-FORMOTEROL FUMARATE 160-4.5 MCG/ACT IN AERO: 2.0000 | INHALATION_SPRAY | Freq: Two times a day (BID) | RESPIRATORY_TRACT | Status: DC

## 2013-09-21 MED ORDER — AZITHROMYCIN 500 MG PO TABS: 500.0000 mg | ORAL_TABLET | Freq: Every day | ORAL | Status: DC

## 2013-09-21 NOTE — Progress Notes (Signed)
   Subjective:    Patient ID: Rachel Santiago, female    DOB: [birth year], 53 y.o.   MRN: [id number]  HPI One week ago she developed a cough no fever, chills, sore throat or earache. The cough continued through the week and got worse 2 days ago associated with headache yesterday she also developed a fever with continued cough that is now painful. She does not smoke and has spring and fall allergies. Her diabetes is being managed by Dr. Forde Dandy and her last visit was apparently a good one. She has been using Xopenex to help with her breathing.  Review of Systems     Objective:   Physical Exam alert and in no distress. Tympanic membranes and canals are normal. Throat is clear. Tonsils are normal. Neck is supple without adenopathy or thyromegaly. Cardiac exam shows a regular sinus rhythm without murmurs or gallops. Lungs show expiratory wheezing to auscultation.        Assessment & Plan:  Diabetes mellitus type 2, insulin dependent  Asthmatic bronchitis - Plan: azithromycin (ZITHROMAX) 500 MG tablet, budesonide-formoterol (SYMBICORT) 160-4.5 MCG/ACT inhaler  Acute URI  Asthma exacerbation, mild - Plan: budesonide-formoterol (SYMBICORT) 160-4.5 MCG/ACT inhaler, levalbuterol (XOPENEX HFA) 45 MCG/ACT inhaler  Allergic rhinitis due to allergen  Since her coughing has gotten worse , I will place her on Symbicort. Discussed use of oral steroids but because of her diabetes I do not think it would be prudent. We'll also treat her with azithromycin. She will call me if not entirely better after 7-10 days.

## 2013-09-21 NOTE — Patient Instructions (Signed)
Use the Symbicort 2 puffs twice per day and he may use his Xopenex as needed if you need extra help with cough and congestion. When you finish the antibiotic give it at least a week and if not totally back to normal call me

## 2013-10-01 ENCOUNTER — Telehealth: Payer: Self-pay | Admitting: Family Medicine

## 2013-10-01 DIAGNOSIS — J45909 Unspecified asthma, uncomplicated: Secondary | ICD-10-CM

## 2013-10-01 MED ORDER — AZITHROMYCIN 500 MG PO TABS: 500.0000 mg | ORAL_TABLET | Freq: Every day | ORAL | Status: DC

## 2013-10-01 NOTE — Telephone Encounter (Signed)
Pt.notified

## 2013-10-01 NOTE — Telephone Encounter (Signed)
Pt called and states she is 85% better, but you advised if not 100% better to let you know.  So she is requesting a refill of her antibiotic to [facility].

## 2013-10-01 NOTE — Telephone Encounter (Signed)
Let her know that I called the medication in. 

## 2013-12-08 IMAGING — US US BIOPSY
1 series · 13 of 24 positions shown · non-contrast
Comparison: none

CLINICAL HISTORY: 51-year-old with hepatomegaly and slightly
elevated liver enzymes.

[Series 1: us biopsy · 0.32mm/px · 13 of 24 slices shown]
[im 1/24]
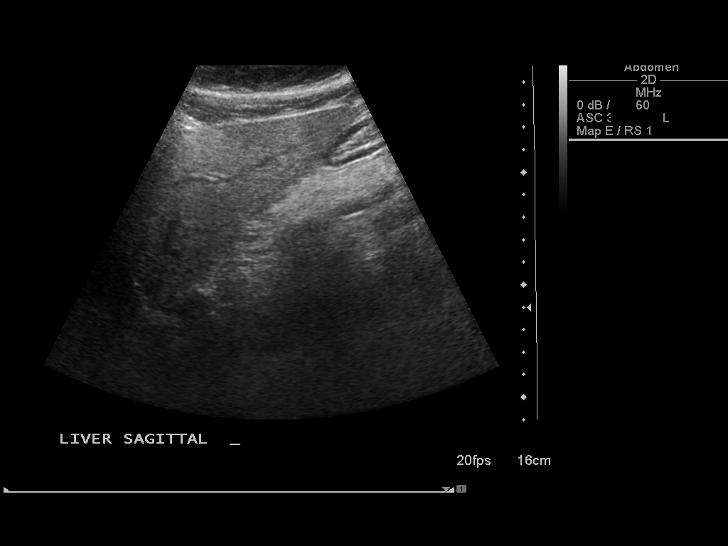
[im 3/24]
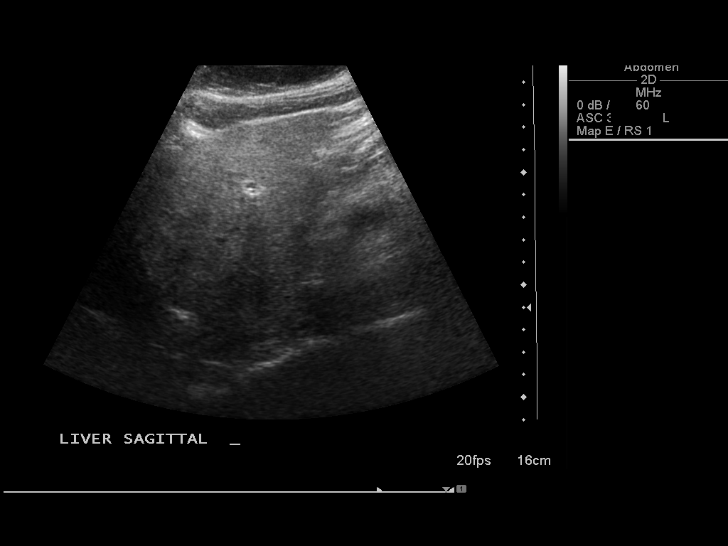
[im 5/24]
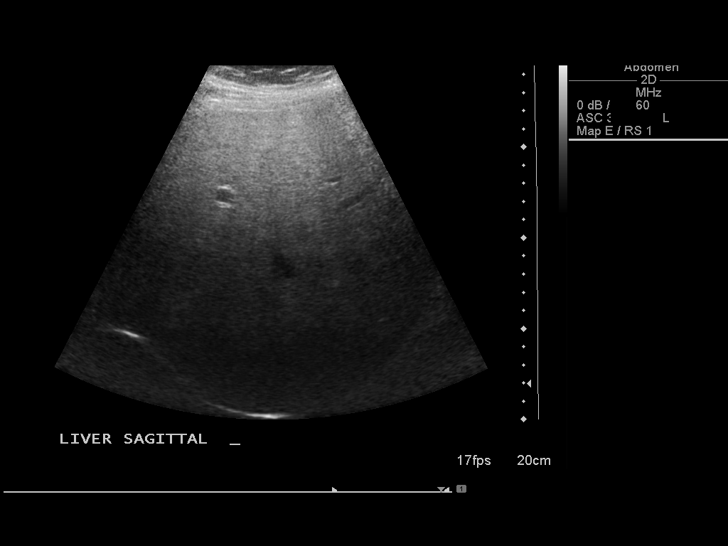
[im 7/24]
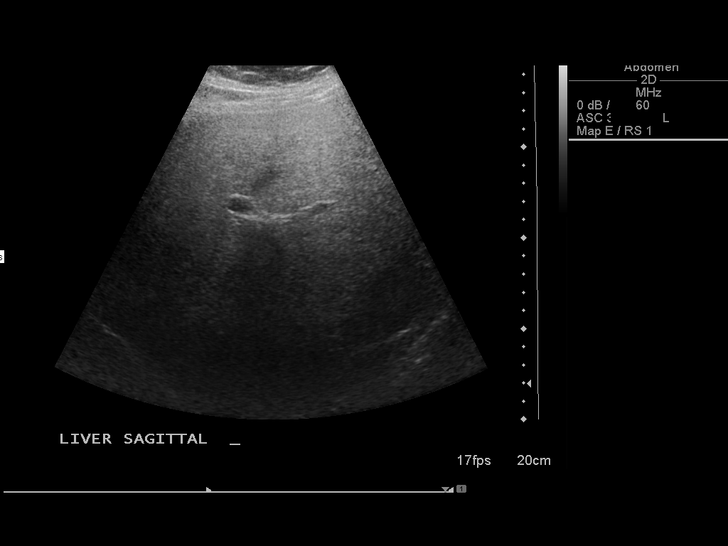
[im 9/24]
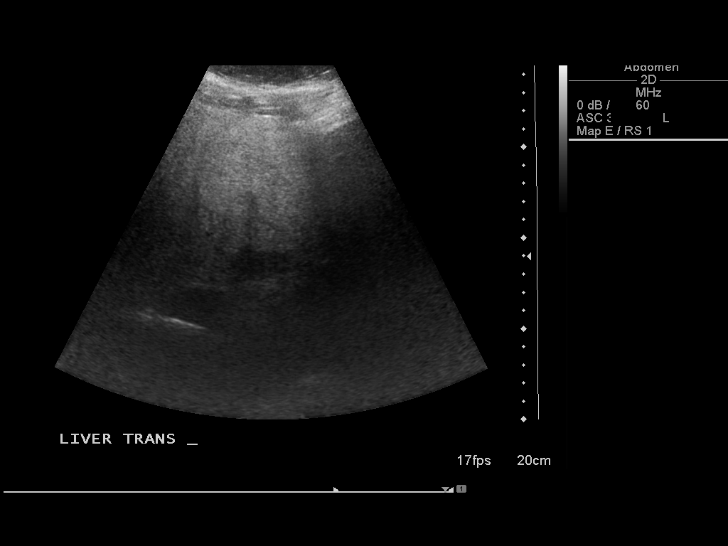
[im 11/24]
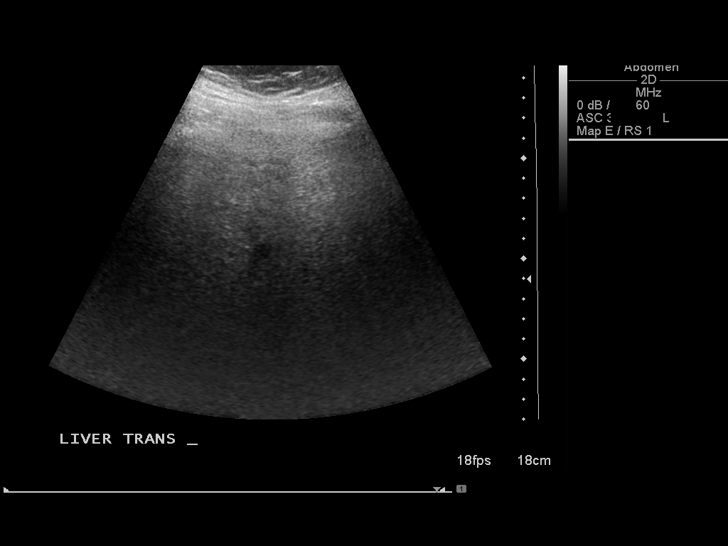
[im 13/24]
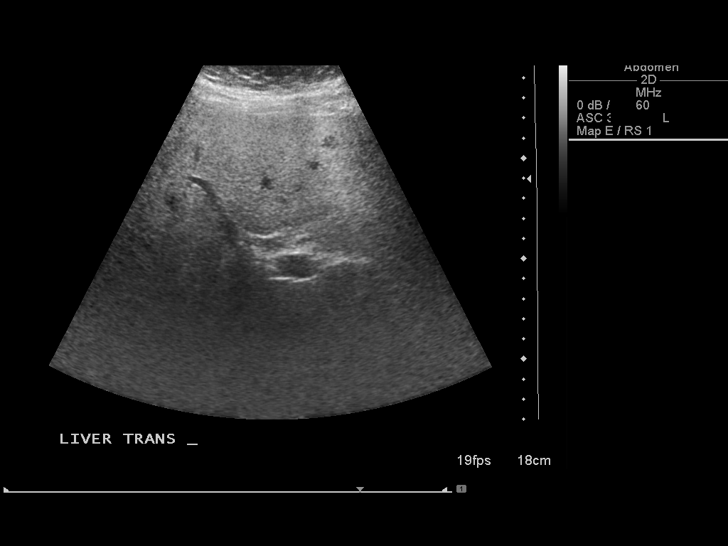
[im 14/24]
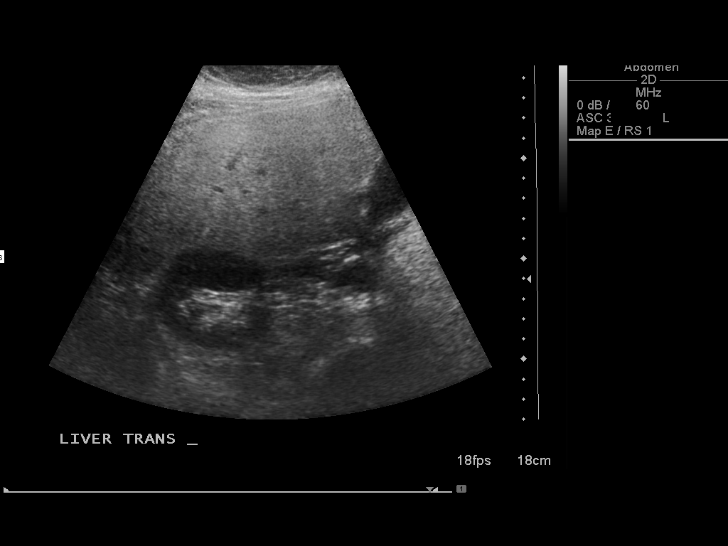
[im 16/24]
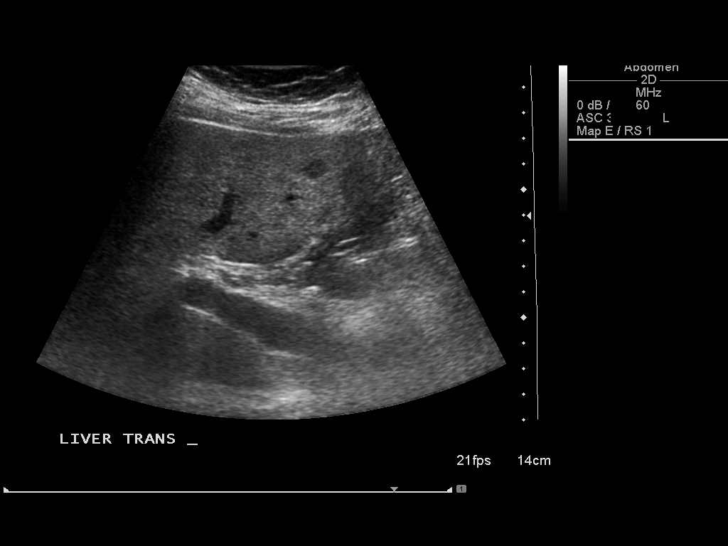
[im 18/24]
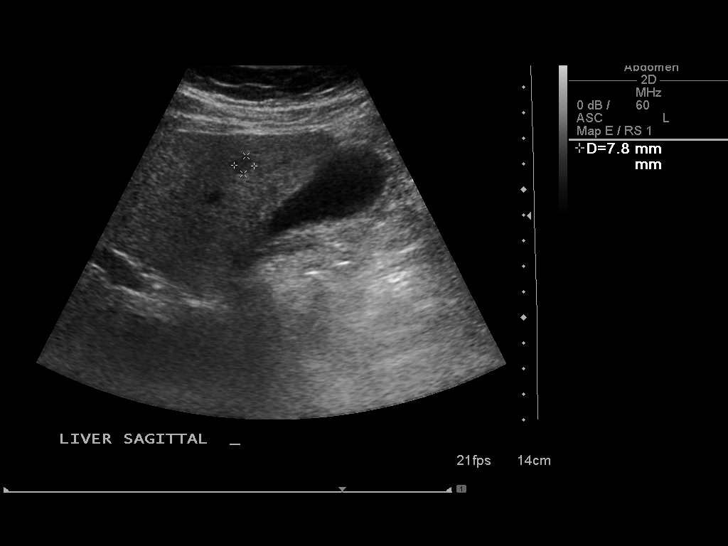
[im 20/24]
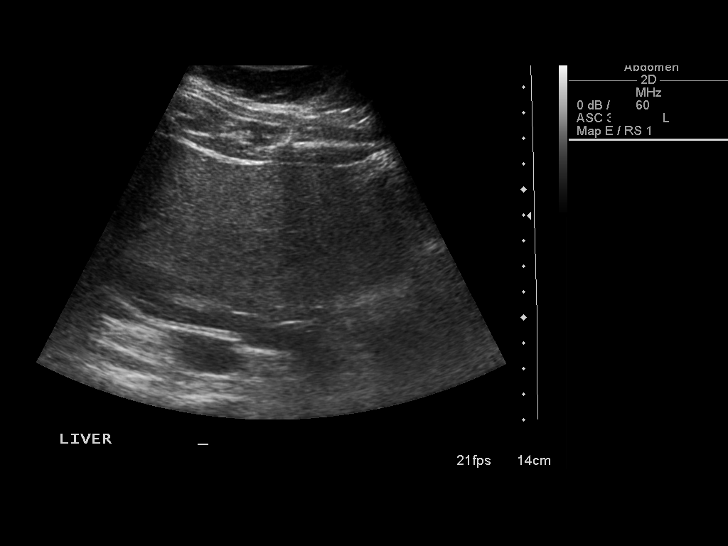
[im 22/24]
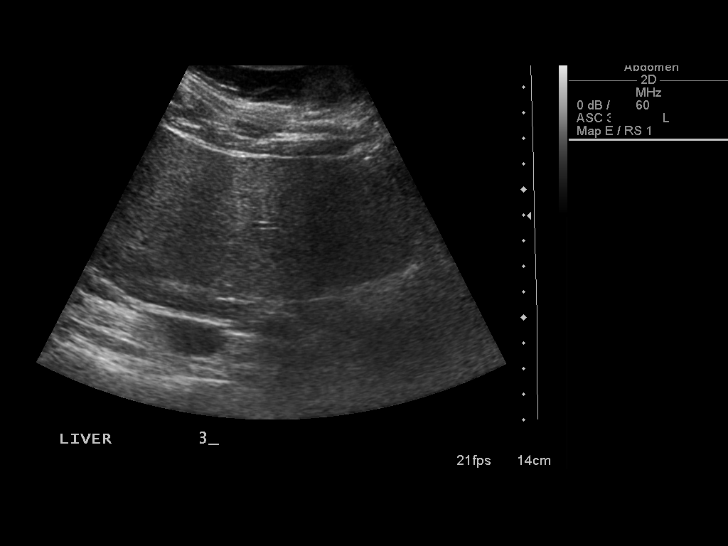
[im 24/24]
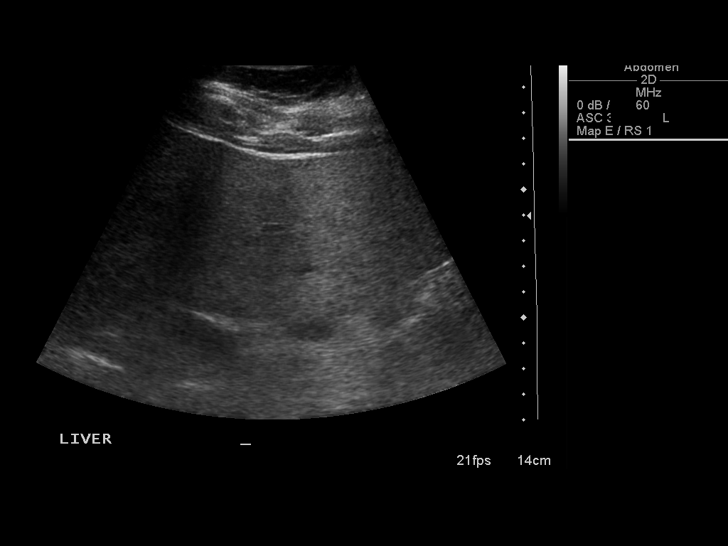

[13 of 24 positions shown; findings below may reference images not displayed]

PROCEDURE(S): ULTRASOUND GUIDED LIVER BIOPSY

Medications:Versed 4 mg, Fentanyl 100 mcg. A radiology nurse
monitored the patient for moderate sedation.

Moderate sedation time:30 minutes

Fluoroscopy time: None

Procedure:The procedure was explained to the patient.  The risks
and benefits of the procedure were discussed and the patient's
questions were addressed.  Informed consent was obtained from the
patient.  The liver was evaluated with ultrasound.  The right side
of the abdomen was prepped and draped in a sterile fashion. The
skin was anesthetized with lidocaine.  A 17 gauge coaxial needle
was directed into the right hepatic lobe with ultrasound guidance.
Three core biopsies were obtained with an 18 gauge needle.  The 17
gauge needle was removed without complication.
FINDINGS: The liver is large and demonstrates diffusely increased
echogenicity compared to the adjacent right kidney.  This finding
may represent hepatic steatosis.  There is a focal hypoechoic
nodule in the right hepatic lobe near the gallbladder.  This nodule
measures up to 8 mm.

Complications: None
IMPRESSION: Ultrasound guided biopsy of the liver.

Indeterminate 8 mm hypoechoic nodule in the right hepatic lobe near
the gallbladder.  This finding was discussed with Dr. Bino.
Consider further evaluation of this lesion with MRI.

## 2014-05-20 ENCOUNTER — Encounter: Payer: Self-pay | Admitting: Internal Medicine

## 2014-07-22 ENCOUNTER — Other Ambulatory Visit: Payer: Self-pay | Admitting: Obstetrics and Gynecology

## 2014-07-23 LAB — CYTOLOGY - PAP

## 2015-07-11 ENCOUNTER — Encounter: Payer: Self-pay | Admitting: Family Medicine

## 2015-07-11 LAB — HM DIABETES EYE EXAM

## 2016-03-14 DIAGNOSIS — R740 Nonspecific elevation of levels of transaminase and lactic acid dehydrogenase [LDH]: Secondary | ICD-10-CM | POA: Diagnosis not present

## 2016-03-14 DIAGNOSIS — E1129 Type 2 diabetes mellitus with other diabetic kidney complication: Secondary | ICD-10-CM | POA: Diagnosis not present

## 2016-03-14 DIAGNOSIS — N08 Glomerular disorders in diseases classified elsewhere: Secondary | ICD-10-CM | POA: Diagnosis not present

## 2016-03-14 DIAGNOSIS — I10 Essential (primary) hypertension: Secondary | ICD-10-CM | POA: Diagnosis not present

## 2016-05-04 DIAGNOSIS — Z23 Encounter for immunization: Secondary | ICD-10-CM | POA: Diagnosis not present

## 2016-07-18 DIAGNOSIS — F329 Major depressive disorder, single episode, unspecified: Secondary | ICD-10-CM | POA: Diagnosis not present

## 2016-07-18 DIAGNOSIS — I10 Essential (primary) hypertension: Secondary | ICD-10-CM | POA: Diagnosis not present

## 2016-07-18 DIAGNOSIS — N08 Glomerular disorders in diseases classified elsewhere: Secondary | ICD-10-CM | POA: Diagnosis not present

## 2016-07-18 DIAGNOSIS — R740 Nonspecific elevation of levels of transaminase and lactic acid dehydrogenase [LDH]: Secondary | ICD-10-CM | POA: Diagnosis not present

## 2016-07-18 DIAGNOSIS — E784 Other hyperlipidemia: Secondary | ICD-10-CM | POA: Diagnosis not present

## 2016-07-18 DIAGNOSIS — E1129 Type 2 diabetes mellitus with other diabetic kidney complication: Secondary | ICD-10-CM | POA: Diagnosis not present

## 2016-07-26 DIAGNOSIS — E1129 Type 2 diabetes mellitus with other diabetic kidney complication: Secondary | ICD-10-CM | POA: Diagnosis not present

## 2016-07-26 DIAGNOSIS — N08 Glomerular disorders in diseases classified elsewhere: Secondary | ICD-10-CM | POA: Diagnosis not present

## 2016-07-26 DIAGNOSIS — Z6833 Body mass index (BMI) 33.0-33.9, adult: Secondary | ICD-10-CM | POA: Diagnosis not present

## 2016-07-26 DIAGNOSIS — I10 Essential (primary) hypertension: Secondary | ICD-10-CM | POA: Diagnosis not present

## 2016-08-31 DIAGNOSIS — Z01419 Encounter for gynecological examination (general) (routine) without abnormal findings: Secondary | ICD-10-CM | POA: Diagnosis not present

## 2016-08-31 DIAGNOSIS — Z1231 Encounter for screening mammogram for malignant neoplasm of breast: Secondary | ICD-10-CM | POA: Diagnosis not present

## 2016-08-31 DIAGNOSIS — Z6831 Body mass index (BMI) 31.0-31.9, adult: Secondary | ICD-10-CM | POA: Diagnosis not present

## 2016-08-31 LAB — HM MAMMOGRAPHY

## 2016-11-28 DIAGNOSIS — N08 Glomerular disorders in diseases classified elsewhere: Secondary | ICD-10-CM | POA: Diagnosis not present

## 2016-11-28 DIAGNOSIS — Z1389 Encounter for screening for other disorder: Secondary | ICD-10-CM | POA: Diagnosis not present

## 2016-11-28 DIAGNOSIS — E1129 Type 2 diabetes mellitus with other diabetic kidney complication: Secondary | ICD-10-CM | POA: Diagnosis not present

## 2016-11-28 DIAGNOSIS — K760 Fatty (change of) liver, not elsewhere classified: Secondary | ICD-10-CM | POA: Diagnosis not present

## 2016-11-28 DIAGNOSIS — I10 Essential (primary) hypertension: Secondary | ICD-10-CM | POA: Diagnosis not present

## 2016-12-09 DIAGNOSIS — J3089 Other allergic rhinitis: Secondary | ICD-10-CM | POA: Diagnosis not present

## 2016-12-09 DIAGNOSIS — R05 Cough: Secondary | ICD-10-CM | POA: Diagnosis not present

## 2017-02-11 DIAGNOSIS — H524 Presbyopia: Secondary | ICD-10-CM | POA: Diagnosis not present

## 2017-02-11 DIAGNOSIS — H35031 Hypertensive retinopathy, right eye: Secondary | ICD-10-CM | POA: Diagnosis not present

## 2017-02-11 DIAGNOSIS — H35032 Hypertensive retinopathy, left eye: Secondary | ICD-10-CM | POA: Diagnosis not present

## 2017-02-11 DIAGNOSIS — H25013 Cortical age-related cataract, bilateral: Secondary | ICD-10-CM | POA: Diagnosis not present

## 2017-02-11 DIAGNOSIS — H2513 Age-related nuclear cataract, bilateral: Secondary | ICD-10-CM | POA: Diagnosis not present

## 2017-02-11 DIAGNOSIS — H04213 Epiphora due to excess lacrimation, bilateral lacrimal glands: Secondary | ICD-10-CM | POA: Diagnosis not present

## 2017-02-11 DIAGNOSIS — E1165 Type 2 diabetes mellitus with hyperglycemia: Secondary | ICD-10-CM | POA: Diagnosis not present

## 2017-02-11 LAB — HM DIABETES EYE EXAM

## 2017-02-19 ENCOUNTER — Encounter: Payer: Self-pay | Admitting: Family Medicine

## 2017-03-14 DIAGNOSIS — M722 Plantar fascial fibromatosis: Secondary | ICD-10-CM | POA: Diagnosis not present

## 2017-05-06 DIAGNOSIS — K760 Fatty (change of) liver, not elsewhere classified: Secondary | ICD-10-CM | POA: Diagnosis not present

## 2017-05-06 DIAGNOSIS — Z1389 Encounter for screening for other disorder: Secondary | ICD-10-CM | POA: Diagnosis not present

## 2017-05-06 DIAGNOSIS — Z23 Encounter for immunization: Secondary | ICD-10-CM | POA: Diagnosis not present

## 2017-05-06 DIAGNOSIS — N08 Glomerular disorders in diseases classified elsewhere: Secondary | ICD-10-CM | POA: Diagnosis not present

## 2017-05-06 DIAGNOSIS — E1129 Type 2 diabetes mellitus with other diabetic kidney complication: Secondary | ICD-10-CM | POA: Diagnosis not present

## 2017-05-06 DIAGNOSIS — C8590 Non-Hodgkin lymphoma, unspecified, unspecified site: Secondary | ICD-10-CM | POA: Diagnosis not present

## 2017-06-10 ENCOUNTER — Ambulatory Visit (INDEPENDENT_AMBULATORY_CARE_PROVIDER_SITE_OTHER): Payer: BLUE CROSS/BLUE SHIELD | Admitting: Family Medicine

## 2017-06-10 ENCOUNTER — Ambulatory Visit
Admission: RE | Admit: 2017-06-10 | Discharge: 2017-06-10 | Disposition: A | Discharge status: Home / Self Care | Payer: BLUE CROSS/BLUE SHIELD | Source: Ambulatory Visit | Attending: Family Medicine | Admitting: Family Medicine

## 2017-06-10 VITALS — BP 120/78 | Wt 187.4 lb

## 2017-06-10 DIAGNOSIS — M25511 Pain in right shoulder: Secondary | ICD-10-CM | POA: Diagnosis not present

## 2017-06-10 NOTE — Progress Notes (Signed)
   Subjective:    Patient ID: Rachel Santiago, female    DOB: 1960/12/12, 56 y.o.   MRN: [id number]  HPI She complains of a one-day history of right shoulder pain that in fact woke her up last night. No recent history of injury or overactivity. No numbness, tingling, weakness, chest pain or shortness of breath.   Review of Systems     Objective:   Physical Exam   Pain is noted on active range of motion making it difficult for her to even get to 90.no palpable tenderness. Passive range of motion was much less painful. Negative sulcus sign. Neer's and Hawkins test was also negative.      Assessment & Plan:  Acute pain of right shoulder - Plan: DG Shoulder Right the x-ray is negative.I will have her use 2 Aleve twice per day for the next 7-14 days and if continued difficulty, set up another appointment.

## 2017-06-27 ENCOUNTER — Other Ambulatory Visit: Payer: Self-pay | Admitting: Occupational Medicine

## 2017-06-27 ENCOUNTER — Ambulatory Visit: Payer: Self-pay

## 2017-06-27 DIAGNOSIS — M25511 Pain in right shoulder: Secondary | ICD-10-CM

## 2017-06-27 DIAGNOSIS — M25512 Pain in left shoulder: Secondary | ICD-10-CM

## 2017-07-01 ENCOUNTER — Encounter: Payer: Self-pay | Admitting: Family Medicine

## 2017-07-01 ENCOUNTER — Ambulatory Visit: Payer: BLUE CROSS/BLUE SHIELD | Admitting: Family Medicine

## 2017-07-01 VITALS — BP 140/80 | HR 80 | Resp 16 | Wt 189.4 lb

## 2017-07-01 DIAGNOSIS — L237 Allergic contact dermatitis due to plants, except food: Secondary | ICD-10-CM | POA: Diagnosis not present

## 2017-07-01 MED ORDER — TRIAMCINOLONE ACETONIDE 0.5 % EX OINT: 1.0000 "application " | TOPICAL_OINTMENT | Freq: Two times a day (BID) | CUTANEOUS | 0 refills | Status: DC

## 2017-07-01 NOTE — Patient Instructions (Signed)
Poison Ivy Dermatitis Poison ivy dermatitis is redness and soreness (inflammation) of the skin. It is caused by a chemical that is found on the leaves of the poison ivy plant. You may also have itching, a rash, and blisters. Symptoms often clear up in 1-2 weeks. You may get this condition by touching a poison ivy plant. You can also get it by touching something that has the chemical on it. This may include animals or objects that have come in contact with the plant. Follow these instructions at home: General instructions  Take or apply over-the-counter and prescription medicines only as told by your doctor.  If you touch poison ivy, wash your skin with soap and cold water right away.  Use hydrocortisone creams or calamine lotion as needed to help with itching.  Take oatmeal baths as needed. Use colloidal oatmeal. You can get this at a pharmacy or grocery store. Follow the instructions on the package.  Do not scratch or rub your skin.  While you have the rash, wash your clothes right after you wear them. Prevention  Know what poison ivy looks like so you can avoid it. This plant has three leaves with flowering branches on a single stem. The leaves are glossy. They have uneven edges that come to a point at the front.  If you have touched poison ivy, wash with soap and water right away. Be sure to wash under your fingernails.  When hiking or camping, wear long pants, a long-sleeved shirt, tall socks, and hiking boots. You can also use a lotion on your skin that helps to prevent contact with the chemical on the plant.  If you think that your clothes or outdoor gear came in contact with poison ivy, rinse them off with a garden hose before you bring them inside your house. Contact a doctor if:  You have open sores in the rash area.  You have more redness, swelling, or pain in the affected area.  You have redness that spreads beyond the rash area.  You have fluid, blood, or pus coming from  the affected area.  You have a fever.  You have a rash over a large area of your body.  You have a rash on your eyes, mouth, or genitals.  Your rash does not get better after a few days. Get help right away if:  Your face swells or your eyes swell shut.  You have trouble breathing.  You have trouble swallowing. This information is not intended to replace advice given to you by your health care provider. Make sure you discuss any questions you have with your health care provider. Document Released: 09/08/2010 Document Revised: 01/12/2016 Document Reviewed: 01/12/2015 Elsevier Interactive Patient Education  2018 [employer]vier Inc.  

## 2017-07-01 NOTE — Progress Notes (Signed)
   Subjective:    Patient ID: Rachel Santiago, female    DOB: [birth year], 56 y.o.   MRN: [id number]  HPI She noted some lesions that started on her arms approximately 1 week ago and they have spread to her arm and to a lesser extent on her abdomen.   Review of Systems     Objective:   Physical Exam Alert and in no distress.  Erythematous vesicular lesions linear in nature noted on her arms.       Assessment & Plan:  Poison ivy - Plan: triamcinolone ointment (KENALOG) 0.5 % Recommend cool compresses, Benadryl at night and use the Kenalog cream as needed.  Discussed using steroid pills but explained to her that at this point it would be more potential harm than benefit.  She will call if she continues to have difficulty.

## 2017-07-04 ENCOUNTER — Other Ambulatory Visit: Payer: Self-pay | Admitting: Occupational Medicine

## 2017-07-04 ENCOUNTER — Ambulatory Visit: Payer: Self-pay

## 2017-07-04 DIAGNOSIS — M546 Pain in thoracic spine: Secondary | ICD-10-CM

## 2017-09-23 DIAGNOSIS — N08 Glomerular disorders in diseases classified elsewhere: Secondary | ICD-10-CM | POA: Diagnosis not present

## 2017-09-23 DIAGNOSIS — I10 Essential (primary) hypertension: Secondary | ICD-10-CM | POA: Diagnosis not present

## 2017-09-23 DIAGNOSIS — E1129 Type 2 diabetes mellitus with other diabetic kidney complication: Secondary | ICD-10-CM | POA: Diagnosis not present

## 2017-09-23 DIAGNOSIS — K760 Fatty (change of) liver, not elsewhere classified: Secondary | ICD-10-CM | POA: Diagnosis not present

## 2017-09-23 DIAGNOSIS — C8590 Non-Hodgkin lymphoma, unspecified, unspecified site: Secondary | ICD-10-CM | POA: Diagnosis not present

## 2017-10-08 DIAGNOSIS — Z1231 Encounter for screening mammogram for malignant neoplasm of breast: Secondary | ICD-10-CM | POA: Diagnosis not present

## 2017-10-08 DIAGNOSIS — Z01419 Encounter for gynecological examination (general) (routine) without abnormal findings: Secondary | ICD-10-CM | POA: Diagnosis not present

## 2017-10-08 DIAGNOSIS — Z6830 Body mass index (BMI) 30.0-30.9, adult: Secondary | ICD-10-CM | POA: Diagnosis not present

## 2017-10-08 LAB — HM PAP SMEAR

## 2017-10-14 LAB — HM MAMMOGRAPHY

## 2018-01-23 ENCOUNTER — Ambulatory Visit: Payer: BLUE CROSS/BLUE SHIELD | Admitting: Family Medicine

## 2018-01-23 VITALS — BP 130/72 | HR 89 | Temp 97.9°F | Ht 65.0 in | Wt 186.2 lb

## 2018-01-23 DIAGNOSIS — M79672 Pain in left foot: Secondary | ICD-10-CM | POA: Diagnosis not present

## 2018-01-23 NOTE — Patient Instructions (Signed)
For the next 10 days at least do ibuprofen 4 pills 3 times per day.  Make sure you wear comfortable shoes

## 2018-01-23 NOTE — Progress Notes (Signed)
   Subjective:    Patient ID: Rachel Santiago, female    DOB: 06-Feb-1961, 57 y.o.   MRN: [id number]  HPI She complains of a 2-week history of left lateral foot pain.  No history of injury or overuse.  No new shoes or surfaces that she is walking on.  No other joints are involved.   Review of Systems     Objective:   Physical Exam Exam of the left foot shows no visual lesions.  Slight tenderness palpation over the head of the fifth metatarsal.  Full motion of the ankle.  No pain on stressing the ankle specifically the peroneal muscles and tendons.       Assessment & Plan:  Left foot pain This could be a tendinitis but does not appear to be an overuse type thing.  Recommend ibuprofen 800 3 times daily for at least 10 days.  She will keep me informed as to the progress.

## 2018-03-20 DIAGNOSIS — N08 Glomerular disorders in diseases classified elsewhere: Secondary | ICD-10-CM | POA: Diagnosis not present

## 2018-03-20 DIAGNOSIS — K760 Fatty (change of) liver, not elsewhere classified: Secondary | ICD-10-CM | POA: Diagnosis not present

## 2018-03-20 DIAGNOSIS — E1129 Type 2 diabetes mellitus with other diabetic kidney complication: Secondary | ICD-10-CM | POA: Diagnosis not present

## 2018-03-20 DIAGNOSIS — F3289 Other specified depressive episodes: Secondary | ICD-10-CM | POA: Diagnosis not present

## 2018-03-26 ENCOUNTER — Encounter: Payer: Self-pay | Admitting: Gastroenterology

## 2018-04-03 DIAGNOSIS — H04213 Epiphora due to excess lacrimation, bilateral lacrimal glands: Secondary | ICD-10-CM | POA: Diagnosis not present

## 2018-04-03 DIAGNOSIS — H524 Presbyopia: Secondary | ICD-10-CM | POA: Diagnosis not present

## 2018-04-03 DIAGNOSIS — H35033 Hypertensive retinopathy, bilateral: Secondary | ICD-10-CM | POA: Diagnosis not present

## 2018-04-03 DIAGNOSIS — H2513 Age-related nuclear cataract, bilateral: Secondary | ICD-10-CM | POA: Diagnosis not present

## 2018-04-03 DIAGNOSIS — E1165 Type 2 diabetes mellitus with hyperglycemia: Secondary | ICD-10-CM | POA: Diagnosis not present

## 2018-04-03 DIAGNOSIS — H25013 Cortical age-related cataract, bilateral: Secondary | ICD-10-CM | POA: Diagnosis not present

## 2018-04-03 LAB — HM DIABETES EYE EXAM

## 2018-04-26 DIAGNOSIS — Z23 Encounter for immunization: Secondary | ICD-10-CM | POA: Diagnosis not present

## 2018-05-07 ENCOUNTER — Ambulatory Visit (AMBULATORY_SURGERY_CENTER): Payer: Self-pay | Admitting: *Deleted

## 2018-05-07 VITALS — Ht 65.0 in | Wt 186.0 lb

## 2018-05-07 DIAGNOSIS — Z1211 Encounter for screening for malignant neoplasm of colon: Secondary | ICD-10-CM

## 2018-05-07 MED ORDER — NA SULFATE-K SULFATE-MG SULF 17.5-3.13-1.6 GM/177ML PO SOLN: ORAL | 0 refills | Status: DC

## 2018-05-07 NOTE — Progress Notes (Signed)
Patient denies any allergies to eggs or soy. Patient denies any problems with anesthesia/sedation. Nausea with general anesthesia per pt. Patient denies any oxygen use at home. Patient denies taking any diet/weight loss medications or blood thinners. EMMI education assisgned to patient on colonoscopy, this was explained and instructions given to patient. Suprep coupon given to pt. $50.

## 2018-05-08 ENCOUNTER — Encounter: Payer: Self-pay | Admitting: Gastroenterology

## 2018-05-21 ENCOUNTER — Encounter: Payer: Self-pay | Admitting: Gastroenterology

## 2018-05-21 ENCOUNTER — Ambulatory Visit (AMBULATORY_SURGERY_CENTER): Payer: BLUE CROSS/BLUE SHIELD | Admitting: Gastroenterology

## 2018-05-21 VITALS — BP 114/60 | HR 71 | Temp 98.6°F | Resp 13 | Ht 65.0 in | Wt 186.0 lb

## 2018-05-21 DIAGNOSIS — Z1211 Encounter for screening for malignant neoplasm of colon: Secondary | ICD-10-CM | POA: Diagnosis not present

## 2018-05-21 DIAGNOSIS — D123 Benign neoplasm of transverse colon: Secondary | ICD-10-CM | POA: Diagnosis not present

## 2018-05-21 DIAGNOSIS — K635 Polyp of colon: Secondary | ICD-10-CM

## 2018-05-21 DIAGNOSIS — D124 Benign neoplasm of descending colon: Secondary | ICD-10-CM | POA: Diagnosis not present

## 2018-05-21 MED ORDER — SODIUM CHLORIDE 0.9 % IV SOLN: 500.0000 mL | Freq: Once | INTRAVENOUS | Status: DC

## 2018-05-21 NOTE — Progress Notes (Signed)
Called to room to assist during endoscopic procedure.  Patient ID and intended procedure confirmed with present staff. Received instructions for my participation in the procedure from the performing physician.  

## 2018-05-21 NOTE — Progress Notes (Signed)
A and O x3. Report to RN. Tolerated MAC anesthesia well.

## 2018-05-21 NOTE — Op Note (Signed)
[place] Patient Name: Rachel Santiago Procedure Date: 05/21/2018 8:26 AM MRN: [id number] Endoscopist: Ladene Artist , MD Age: 57 Referring MD:  Date of Birth: 1961/05/05 Gender: Female Account #: [id number] Procedure:                Colonoscopy Indications:              Screening for colorectal malignant neoplasm Medicines:                Monitored Anesthesia Care Procedure:                Pre-Anesthesia Assessment:                           - Prior to the procedure, a History and Physical                            was performed, and patient medications and                            allergies were reviewed. The patient's tolerance of                            previous anesthesia was also reviewed. The risks                            and benefits of the procedure and the sedation                            options and risks were discussed with the patient.                            All questions were answered, and informed consent                            was obtained. Prior Anticoagulants: The patient has                            taken no previous anticoagulant or antiplatelet                            agents. ASA Grade Assessment: III - A patient with                            severe systemic disease. After reviewing the risks                            and benefits, the patient was deemed in                            satisfactory condition to undergo the procedure.                           After obtaining informed consent, the colonoscope  was passed under direct vision. Throughout the                            procedure, the patient's blood pressure, pulse, and                            oxygen saturations were monitored continuously. The                            Model PCF-[id number]) scope was introduced                            through the anus and advanced to the the cecum,                            identified by  appendiceal orifice and ileocecal                            valve. The ileocecal valve, appendiceal orifice,                            and rectum were photographed. The quality of the                            bowel preparation was good. The colonoscopy was                            performed without difficulty. The patient tolerated                            the procedure well. Scope In: 8:37:29 AM Scope Out: 8:51:26 AM Scope Withdrawal Time: 0 hours 10 minutes 49 seconds  Total Procedure Duration: 0 hours 13 minutes 57 seconds  Findings:                 The perianal and digital rectal examinations were                            normal.                           Two sessile polyps were found in the descending                            colon and transverse colon. The polyps were 7 to 8                            mm in size. These polyps were removed with a cold                            snare. Resection and retrieval were complete.                           The exam was otherwise without abnormality on  direct and retroflexion views. Complications:            No immediate complications. Estimated blood loss:                            None. Estimated Blood Loss:     Estimated blood loss: none. Impression:               - Two 7 to 8 mm polyps in the descending colon and                            in the transverse colon, removed with a cold snare.                            Resected and retrieved.                           - The examination was otherwise normal on direct                            and retroflexion views. Recommendation:           - Repeat colonoscopy in 5 years for surveillance if                            polyp(s) are precancerous, otherwise 10 years.                           - Patient has a contact number available for                            emergencies. The signs and symptoms of potential                            delayed  complications were discussed with the                            patient. Return to normal activities tomorrow.                            Written discharge instructions were provided to the                            patient.                           - Resume previous diet.                           - Continue present medications.                           - Await pathology results. Ladene Artist, MD 05/21/2018 8:59:43 AM This report has been signed electronically.

## 2018-05-21 NOTE — Patient Instructions (Signed)
YOU HAD AN ENDOSCOPIC PROCEDURE TODAY AT THE [facility]:   Refer to the procedure report that was given to you for any specific questions about what was found during the examination.  If the procedure report does not answer your questions, please call your gastroenterologist to clarify.  If you requested that your care partner not be given the details of your procedure findings, then the procedure report has been included in a sealed envelope for you to review at your convenience later.  YOU SHOULD EXPECT: Some feelings of bloating in the abdomen. Passage of more gas than usual.  Walking can help get rid of the air that was put into your GI tract during the procedure and reduce the bloating. If you had a lower endoscopy (such as a colonoscopy or flexible sigmoidoscopy) you may notice spotting of blood in your stool or on the toilet paper. If you underwent a bowel prep for your procedure, you may not have a normal bowel movement for a few days.  Please Note:  You might notice some irritation and congestion in your nose or some drainage.  This is from the oxygen used during your procedure.  There is no need for concern and it should clear up in a day or so.  SYMPTOMS TO REPORT IMMEDIATELY:   Following lower endoscopy (colonoscopy or flexible sigmoidoscopy):  Excessive amounts of blood in the stool  Significant tenderness or worsening of abdominal pains  Swelling of the abdomen that is new, acute  Fever of 100F or higher  For urgent or emergent issues, a gastroenterologist can be reached at any hour by calling [phone].   DIET:  We do recommend a small meal at first, but then you may proceed to your regular diet.  Drink plenty of fluids but you should avoid alcoholic beverages for 24 hours.  MEDICATIONS: Continue present medications.  Please see handouts given to you by your recovery nurse.  ACTIVITY:  You should plan to take it easy for the rest of today and you should NOT  DRIVE or use heavy machinery until tomorrow (because of the sedation medicines used during the test).    FOLLOW UP: Our staff will call the number listed on your records the next business day following your procedure to check on you and address any questions or concerns that you may have regarding the information given to you following your procedure. If we do not reach you, we will leave a message.  However, if you are feeling well and you are not experiencing any problems, there is no need to return our call.  We will assume that you have returned to your regular daily activities without incident.  If any biopsies were taken you will be contacted by phone or by letter within the next 1-3 weeks.  Please call us at [phone] if you have not heard about the biopsies in 3 weeks.   Thank you for allowing us to provide for your healthcare needs today.   SIGNATURES/CONFIDENTIALITY: You and/or your care partner have signed paperwork which will be entered into your electronic medical record.  These signatures attest to the fact that that the information above on your After Visit Summary has been reviewed and is understood.  Full responsibility of the confidentiality of this discharge information lies with you and/or your care-partner. 

## 2018-05-21 NOTE — Progress Notes (Signed)
Pt's states no medical or surgical changes since previsit or office visit. 

## 2018-05-22 ENCOUNTER — Telehealth: Payer: Self-pay | Admitting: *Deleted

## 2018-05-22 NOTE — Telephone Encounter (Signed)
  Follow up Call-  Call back number 05/21/2018  Post procedure Call Back phone  # [phone]  Permission to leave phone message Yes  Some recent data might be hidden     Patient questions:  Do you have a fever, pain , or abdominal swelling? No. Pain Score  0 *  Have you tolerated food without any problems? Yes.    Have you been able to return to your normal activities? Yes.    Do you have any questions about your discharge instructions: Diet   No. Medications  No. Follow up visit  No.  Do you have questions or concerns about your Care? No.  Actions: * If pain score is 4 or above: No action needed, pain <4.

## 2018-06-09 ENCOUNTER — Encounter: Payer: Self-pay | Admitting: Gastroenterology

## 2018-06-26 ENCOUNTER — Other Ambulatory Visit: Payer: Self-pay | Admitting: Family Medicine

## 2018-06-26 DIAGNOSIS — L237 Allergic contact dermatitis due to plants, except food: Secondary | ICD-10-CM

## 2018-06-26 NOTE — Telephone Encounter (Signed)
[facility] is requesting to fill pt Rachel Santiago cream Please [place]

## 2018-06-27 ENCOUNTER — Ambulatory Visit: Payer: 59 | Admitting: Family Medicine

## 2018-06-27 ENCOUNTER — Encounter: Payer: Self-pay | Admitting: Family Medicine

## 2018-06-27 VITALS — BP 130/70 | HR 95 | Temp 98.3°F | Resp 16 | Wt 185.0 lb

## 2018-06-27 DIAGNOSIS — L255 Unspecified contact dermatitis due to plants, except food: Secondary | ICD-10-CM

## 2018-06-27 MED ORDER — PREDNISONE 10 MG (21) PO TBPK: ORAL_TABLET | Freq: Every day | ORAL | 0 refills | Status: DC

## 2018-06-27 MED ORDER — HYDROXYZINE HCL 25 MG PO TABS: 25.0000 mg | ORAL_TABLET | Freq: Three times a day (TID) | ORAL | 0 refills | Status: DC | PRN

## 2018-06-27 NOTE — Progress Notes (Signed)
   Subjective:    Patient ID: Rachel Santiago, female    DOB: 11/23/1960, 57 y.o.   MRN: [id number]  HPI Chief Complaint  Patient presents with  . poison oak    poison oak, on arms stomach throat and not sure if its on face yet or not but has some bumps.pulling weeds.    She is here with complaints of pruritic rash to her neck, chest, abdomen, and bilateral upper extremities. She feels like it is spreading to her face. States she was in her yard pulling up vines. She has had a reaction like this in the past when being in the same area of her yard.  States she is miserable. Has tried topical steroids, Benadryl and cool showers and compresses with minimal relief.   States she is going to the beach later today. Has taken steroids in the past and states she thinks she needs them.   Denies fever, chills, dizziness, sore throat, tongue or lip edema, cough, shortness of breath, wheezing, N/V/D.   Reviewed allergies, medications, past medical, surgical, family, and social history.    Review of Systems Pertinent positives and negatives in the history of present illness.     Objective:   Physical Exam BP 130/70   Pulse 95   Temp 98.3 F (36.8 C)   Resp 16   Wt 185 lb (83.9 kg)   SpO2 98%   BMI 30.79 kg/m   Alert and oriented and in no acute distress. Normal pharyngeal exam.  Linear red, raised rash to her anterior neck, chest and diffuse red, raised bumps to her abdomen, lower back and forearms. No vesicles or pustules. No sign of secondary infection.      Assessment & Plan:  Contact dermatitis due to plants, except food, unspecified contact dermatitis type - Plan: predniSONE (STERAPRED UNI-PAK 21 TAB) 10 MG (21) TBPK tablet, hydrOXYzine (ATARAX/VISTARIL) 25 MG tablet  No acute distress although she is visibly uncomfortable. No pharyngeal edema or URI symptoms.  Oral steroids and hydroxyzine prescribed due to diffuse severely pruritic rash. She will avoid alcohol and driving with  hydroxyzine. Discussed sedating nature and also discussed potential side effects from steroid. Diabetes and she is aware that this may increase blood sugars. Benefit outweighs risk.  Follow up if worsening or as needed.

## 2018-07-21 DIAGNOSIS — N08 Glomerular disorders in diseases classified elsewhere: Secondary | ICD-10-CM | POA: Diagnosis not present

## 2018-07-21 DIAGNOSIS — E1129 Type 2 diabetes mellitus with other diabetic kidney complication: Secondary | ICD-10-CM | POA: Diagnosis not present

## 2018-07-21 DIAGNOSIS — K760 Fatty (change of) liver, not elsewhere classified: Secondary | ICD-10-CM | POA: Diagnosis not present

## 2018-08-21 ENCOUNTER — Encounter: Payer: Self-pay | Admitting: Family Medicine

## 2018-08-21 ENCOUNTER — Ambulatory Visit: Payer: 59 | Admitting: Family Medicine

## 2018-08-21 VITALS — BP 110/74 | HR 91 | Temp 97.7°F | Wt 181.6 lb

## 2018-08-21 DIAGNOSIS — J0100 Acute maxillary sinusitis, unspecified: Secondary | ICD-10-CM | POA: Diagnosis not present

## 2018-08-21 MED ORDER — CLARITHROMYCIN 500 MG PO TABS: 500.0000 mg | ORAL_TABLET | Freq: Two times a day (BID) | ORAL | 0 refills | Status: DC

## 2018-08-21 NOTE — Progress Notes (Signed)
   Subjective:    Patient ID: Rachel Santiago, female    DOB: 10-Oct-1960, 58 y.o.   MRN: [id number]  HPI She complains of a 5-day history of started with sinus congestion, rhinorrhea, PND that has not become purulent as well as hoarse voice.  For the last several days she is also developed a slightly dry cough, right maxillary sinus pain and pressure but no earache, fever, chills.   Review of Systems     Objective:   Physical Exam Alert and in no distress. Tympanic membrane on the right is slightly erythematous with fluid noted behind it, left TM and canal are normal.  Tender to palpation especially on the right maxillary sinus pharyngeal area is normal. Neck is supple without adenopathy or thyromegaly. Cardiac exam shows a regular sinus rhythm without murmurs or gallops. Lungs are clear to auscultation.        Assessment & Plan:  Acute non-recurrent maxillary sinusitis - Plan: clarithromycin (BIAXIN) 500 MG tablet Instructed to call if not entirely better when she finishes the antibiotic.

## 2018-10-21 DIAGNOSIS — H2513 Age-related nuclear cataract, bilateral: Secondary | ICD-10-CM | POA: Diagnosis not present

## 2018-10-21 DIAGNOSIS — H02831 Dermatochalasis of right upper eyelid: Secondary | ICD-10-CM | POA: Diagnosis not present

## 2018-10-21 DIAGNOSIS — E119 Type 2 diabetes mellitus without complications: Secondary | ICD-10-CM | POA: Diagnosis not present

## 2018-10-21 LAB — HM DIABETES EYE EXAM

## 2018-11-11 IMAGING — DX DG SHOULDER 2+V*L*
4 series · 4 of 4 positions shown · non-contrast
Comparison: None.

CLINICAL DATA: Pain following fall

EXAM:
LEFT SHOULDER - 2+ VIEW

[shoulder ap (1 of 2)]
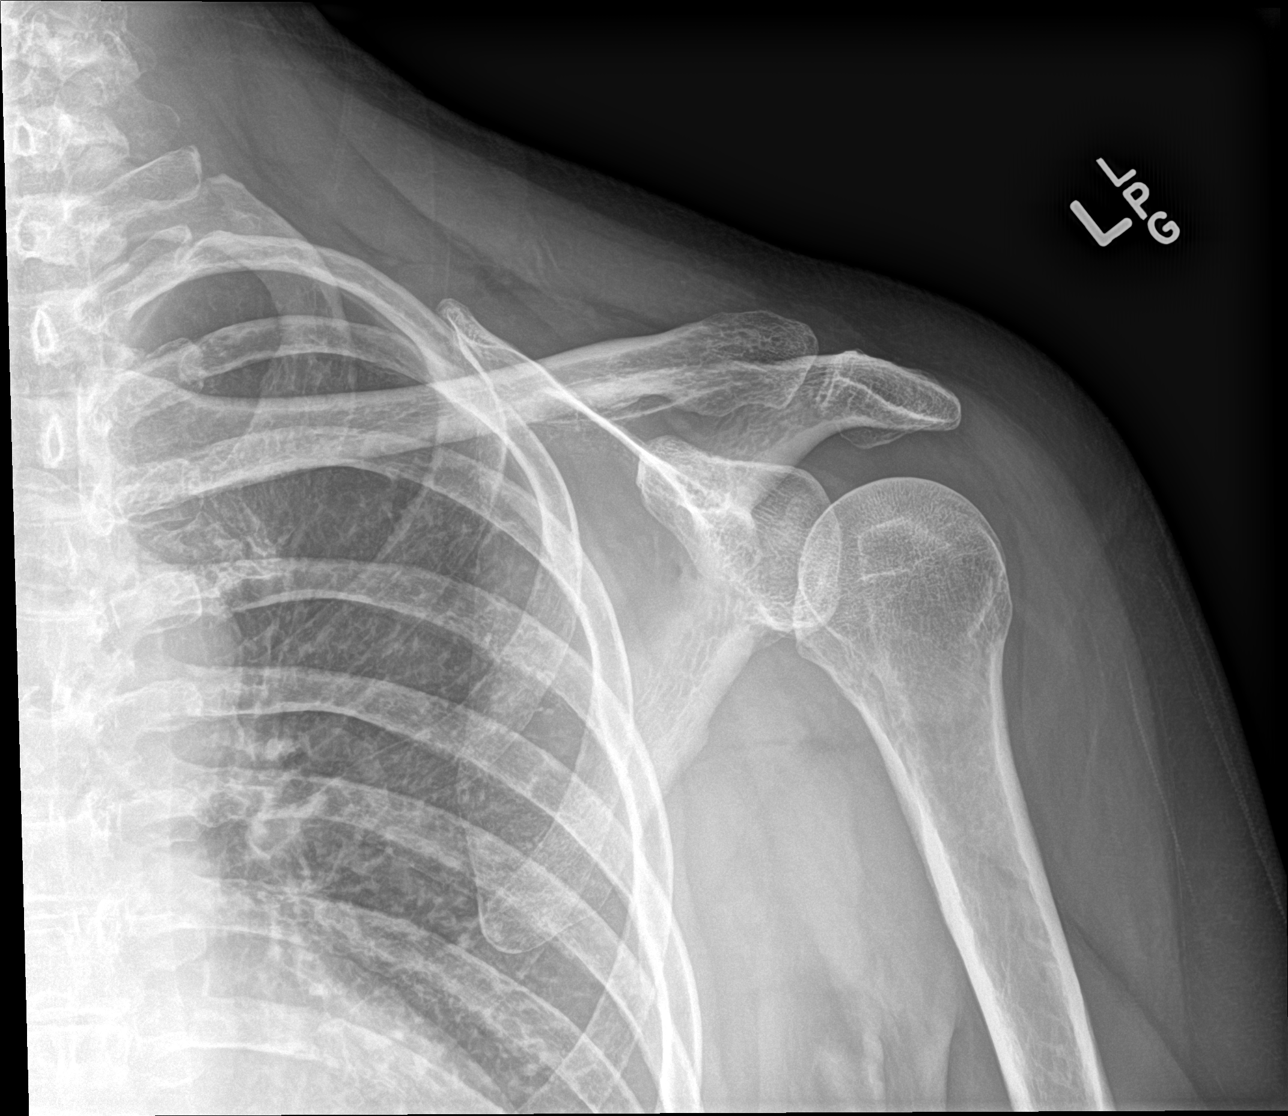

[shoulder ap (2 of 2)]
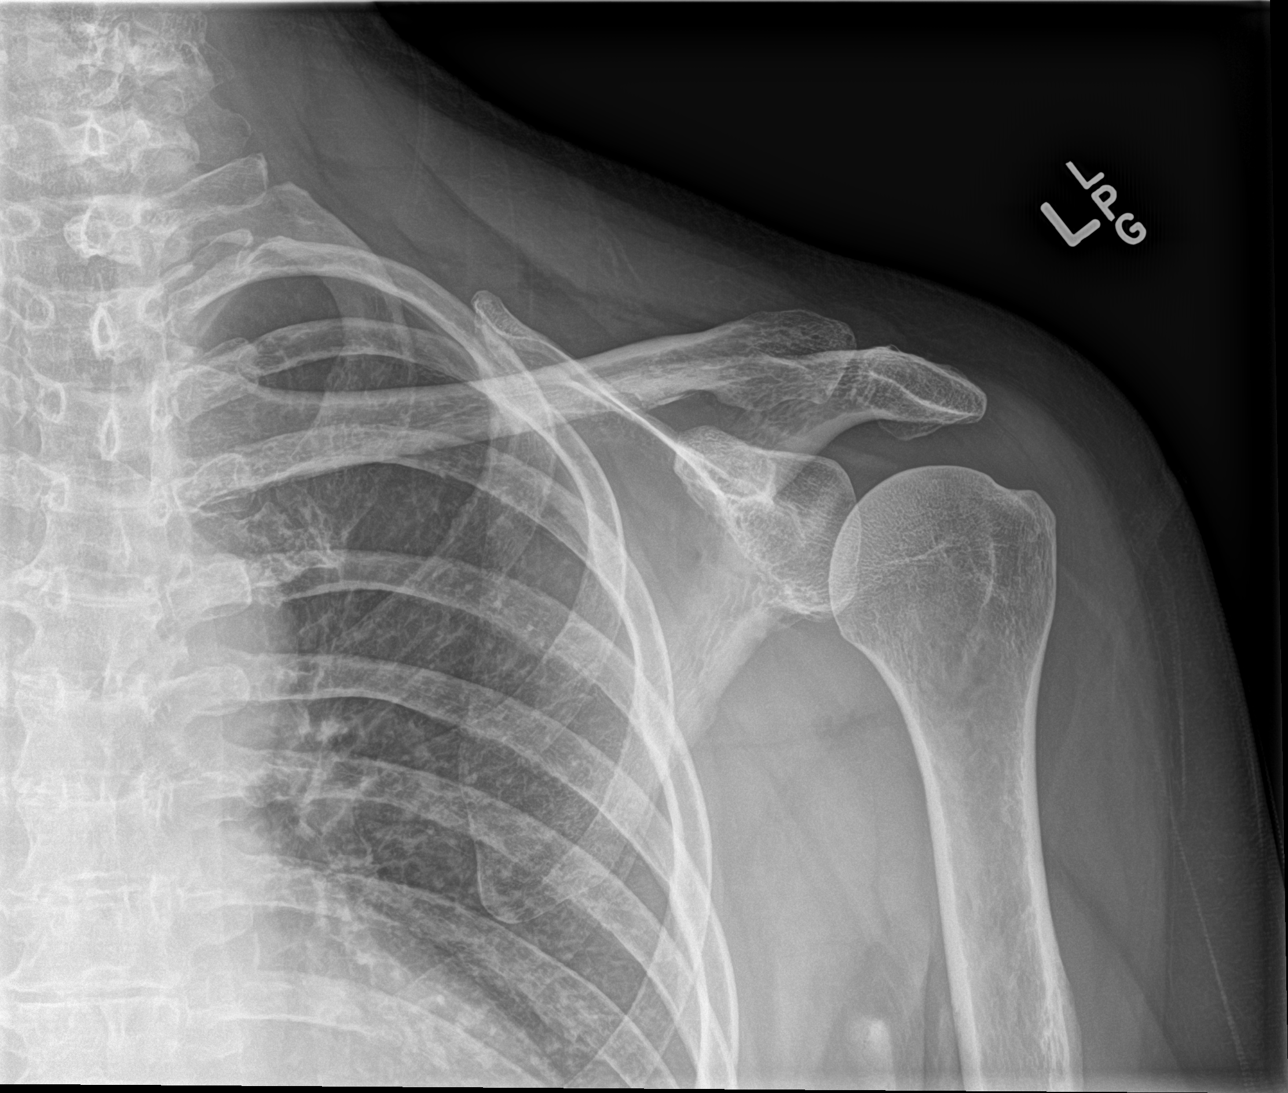

[shoulder y-view]
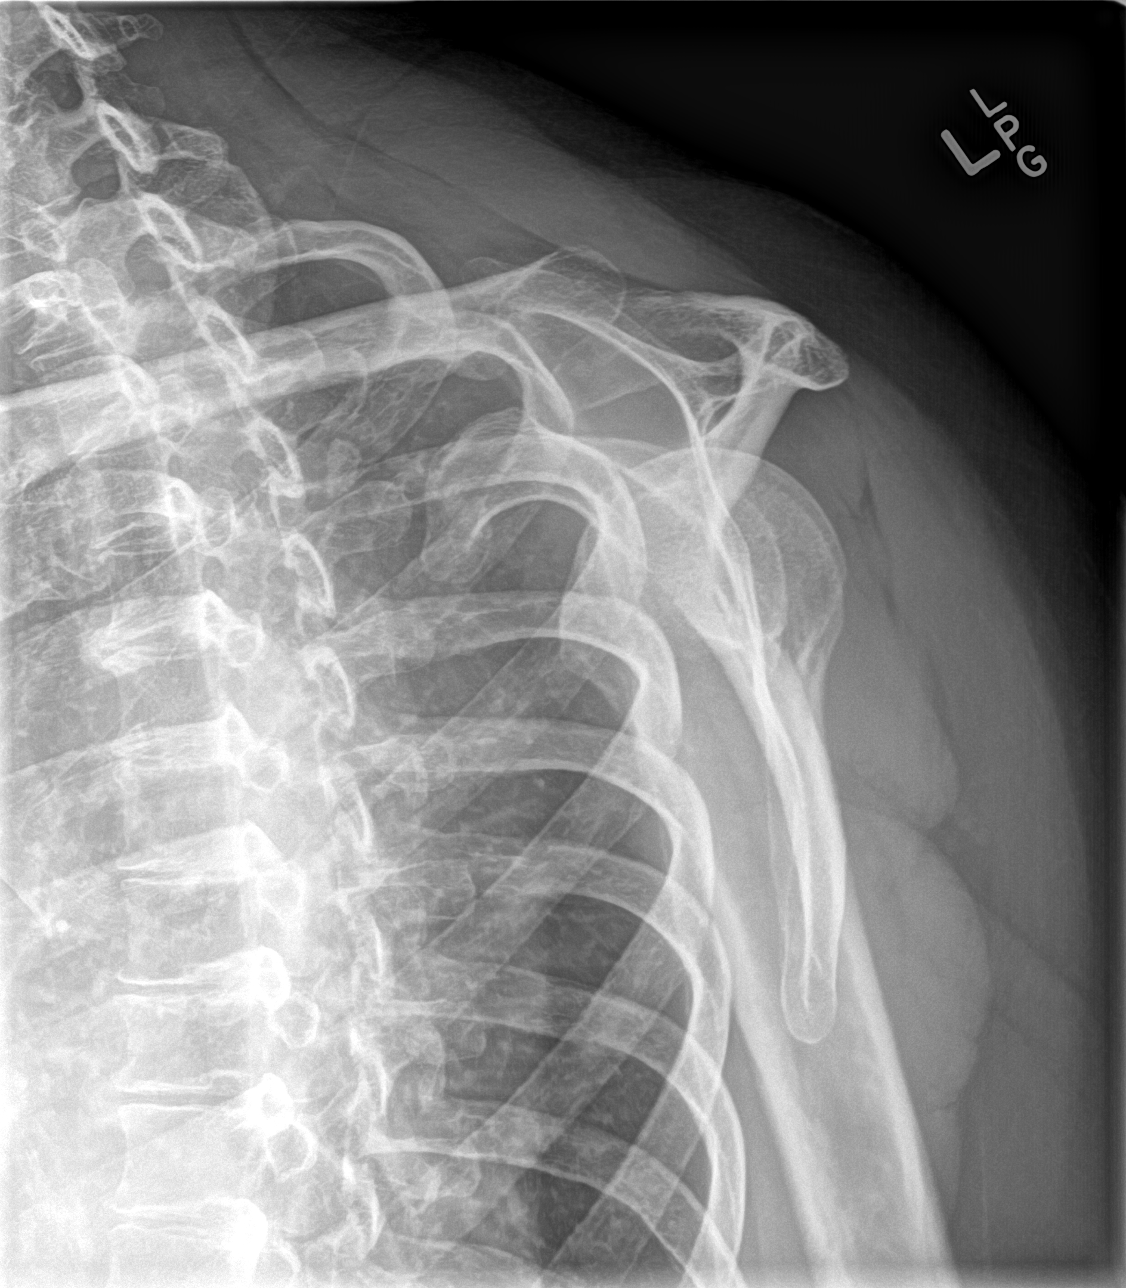

[shoulder axial]
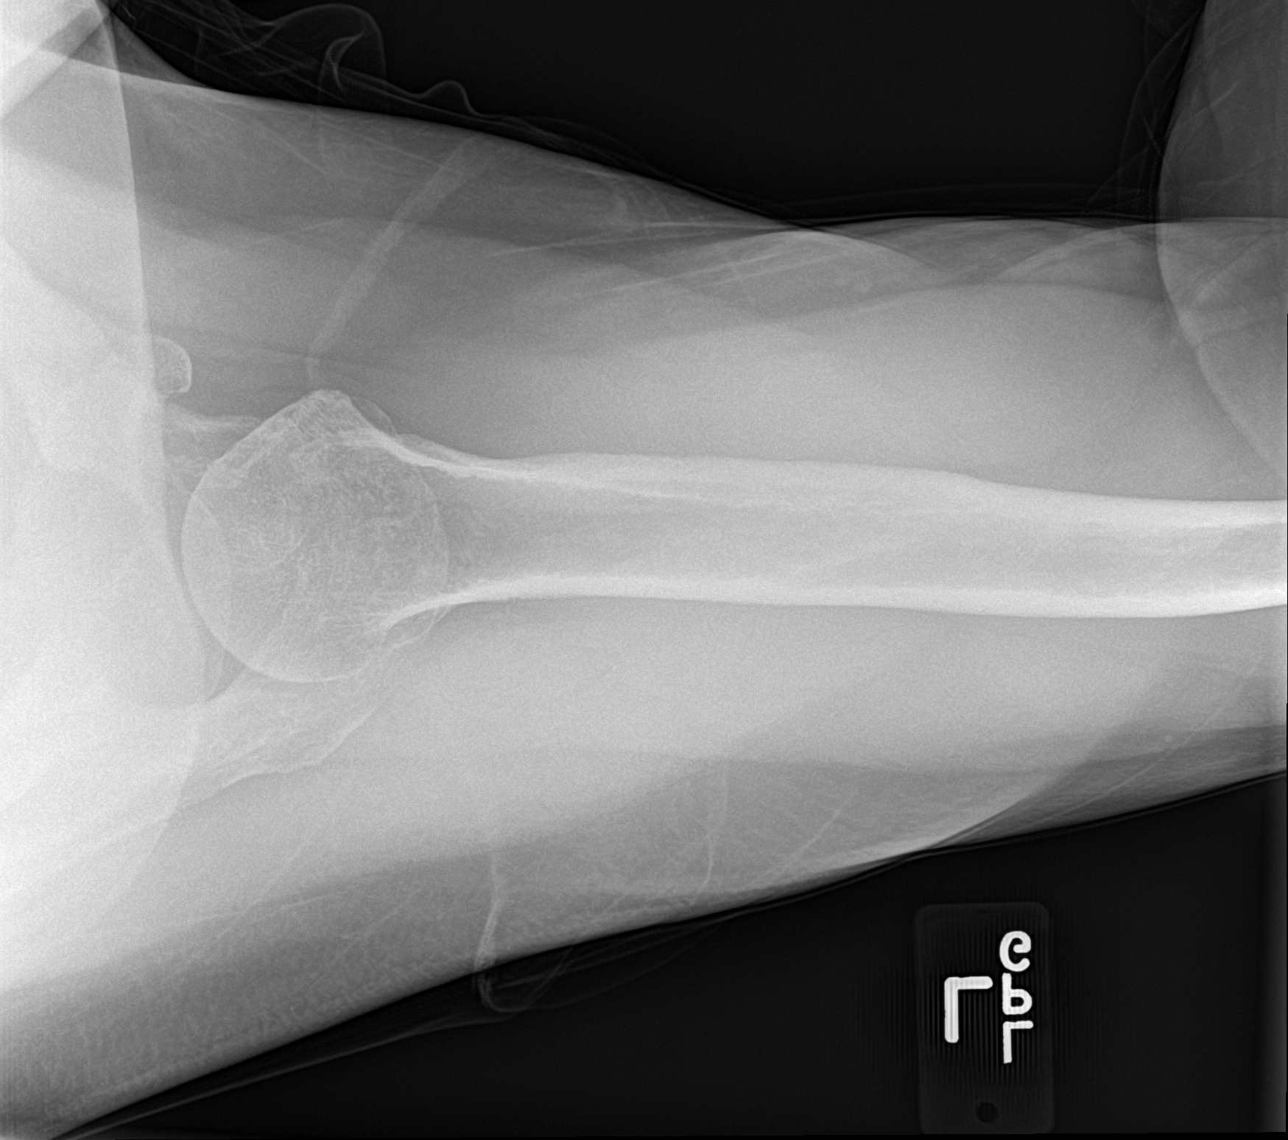

[4 of 4 positions shown; findings below may reference images not displayed]

FINDINGS: Frontal, Y scapular, and axillary images were obtained. There is no
fracture or dislocation. Joint spaces appear normal. No erosive
change. Visualized left lung is clear.
IMPRESSION: No fracture or dislocation.  No evident arthropathy.

## 2018-11-11 IMAGING — DX DG SHOULDER 2+V*R*
4 series · 4 of 4 positions shown · non-contrast
Comparison: Plain films right shoulder 06/10/2017.

CLINICAL DATA: Right shoulder pain due to a fall today. Initial
encounter.

EXAM:
RIGHT SHOULDER - 2+ VIEW

[shoulder ap (1 of 2)]
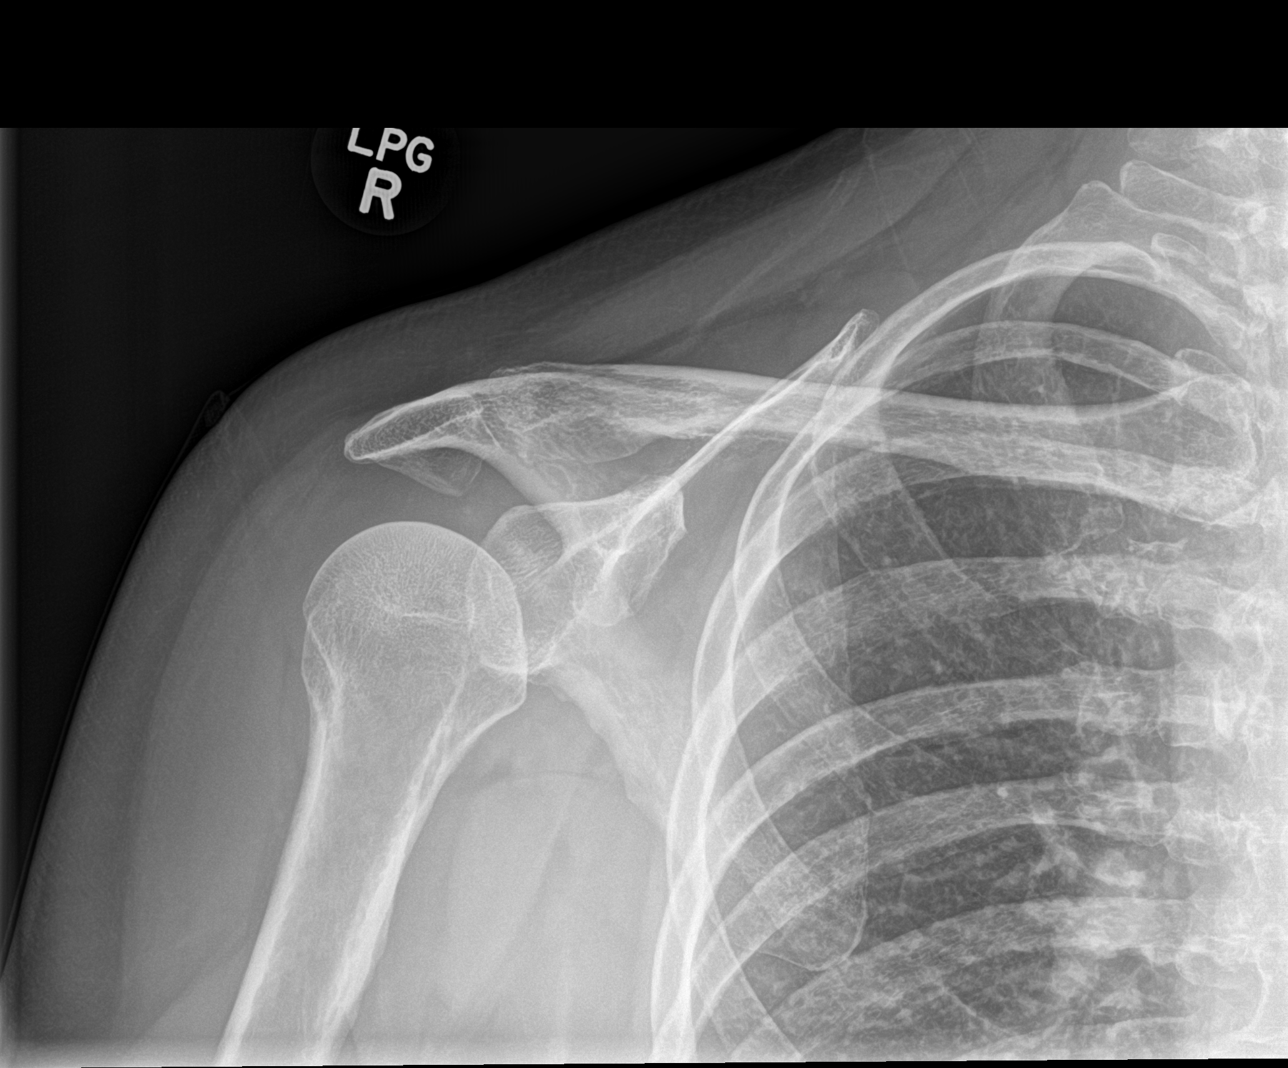

[shoulder ap (2 of 2)]
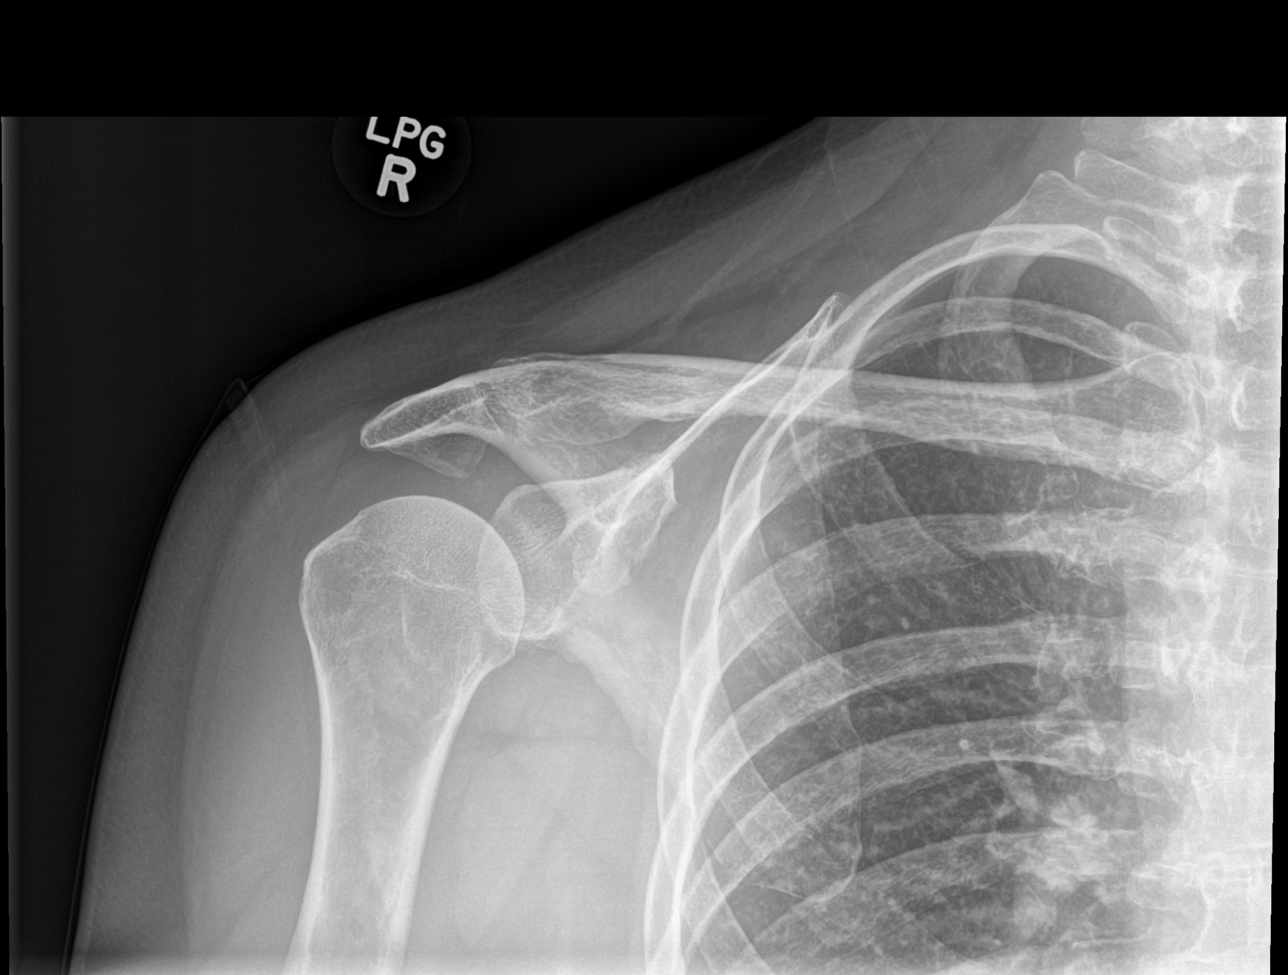

[shoulder y-view]
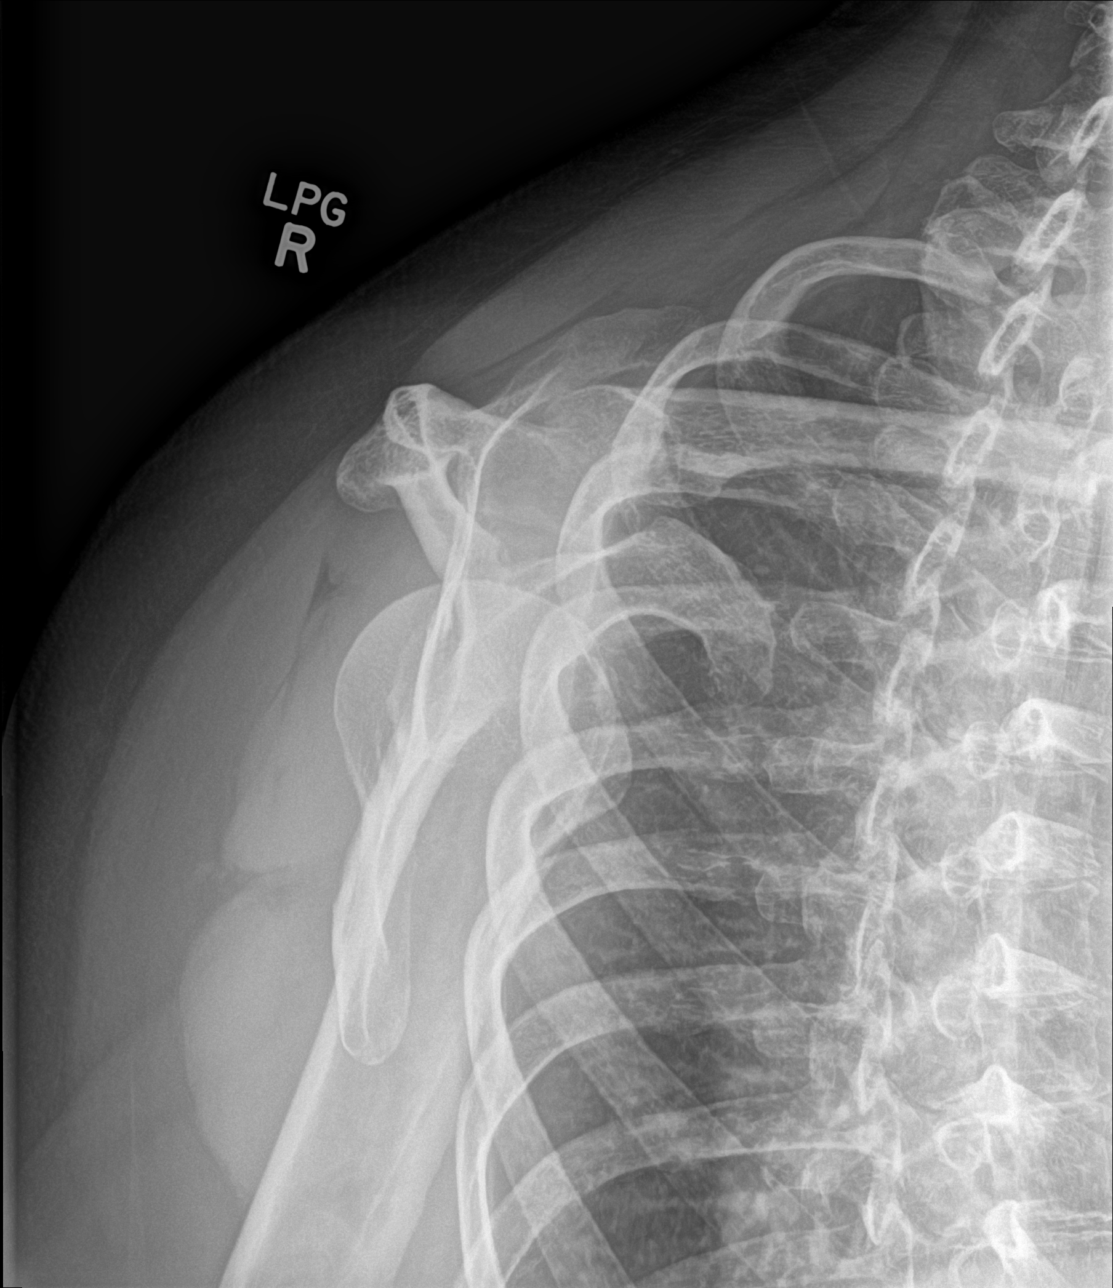

[shoulder axial]
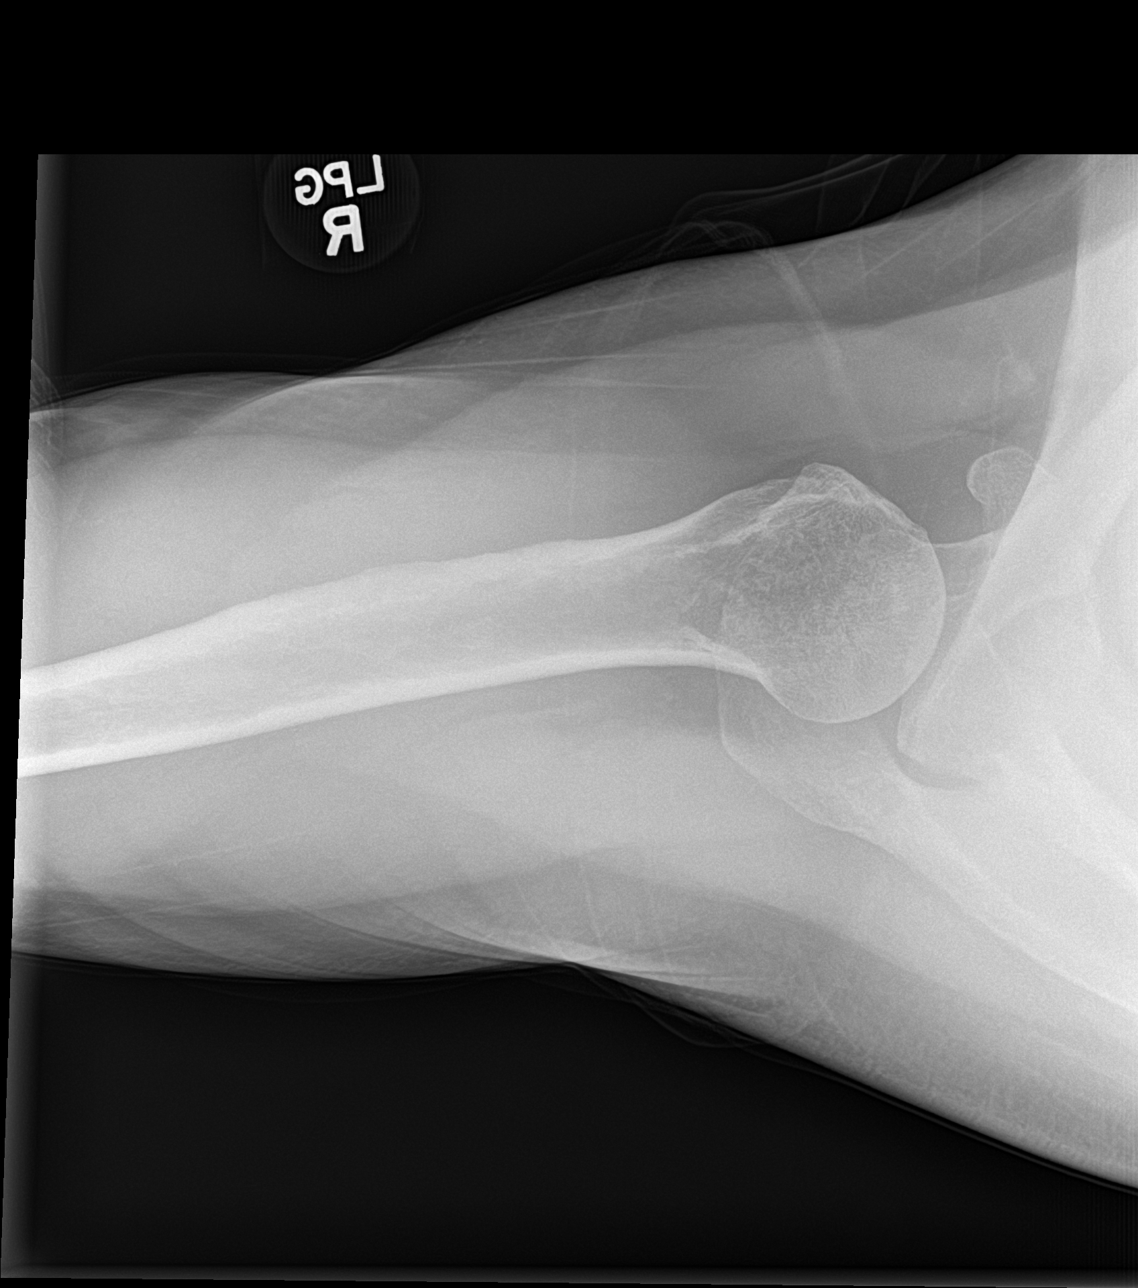

[4 of 4 positions shown; findings below may reference images not displayed]

FINDINGS: There is no evidence of fracture or dislocation. Moderate
acromioclavicular osteoarthritis is noted. Soft tissues are
unremarkable.
IMPRESSION: No acute abnormality.

Moderate acromioclavicular osteoarthritis.

## 2019-06-12 ENCOUNTER — Telehealth: Payer: Self-pay | Admitting: Family Medicine

## 2019-06-12 NOTE — Telephone Encounter (Signed)
Received fax in response to a Continuity of Care records request. They are requiring a signed medical records request form.

## 2019-06-19 ENCOUNTER — Telehealth: Payer: Self-pay | Admitting: Family Medicine

## 2019-06-19 NOTE — Telephone Encounter (Signed)
Requested records received from [facility]  

## 2019-06-30 ENCOUNTER — Encounter: Payer: Self-pay | Admitting: Family Medicine

## 2019-08-17 ENCOUNTER — Ambulatory Visit: Payer: BC Managed Care – PPO | Attending: Internal Medicine

## 2019-08-17 DIAGNOSIS — Z20822 Contact with and (suspected) exposure to covid-19: Secondary | ICD-10-CM

## 2019-08-17 DIAGNOSIS — Z20828 Contact with and (suspected) exposure to other viral communicable diseases: Secondary | ICD-10-CM | POA: Insufficient documentation

## 2019-08-19 LAB — NOVEL CORONAVIRUS, NAA: SARS-CoV-2, NAA: NOT DETECTED

## 2019-09-29 DIAGNOSIS — E1129 Type 2 diabetes mellitus with other diabetic kidney complication: Secondary | ICD-10-CM | POA: Diagnosis not present

## 2019-09-30 DIAGNOSIS — K760 Fatty (change of) liver, not elsewhere classified: Secondary | ICD-10-CM | POA: Diagnosis not present

## 2019-09-30 DIAGNOSIS — N1831 Chronic kidney disease, stage 3a: Secondary | ICD-10-CM | POA: Diagnosis not present

## 2019-09-30 DIAGNOSIS — J45909 Unspecified asthma, uncomplicated: Secondary | ICD-10-CM | POA: Diagnosis not present

## 2019-09-30 DIAGNOSIS — E1129 Type 2 diabetes mellitus with other diabetic kidney complication: Secondary | ICD-10-CM | POA: Diagnosis not present

## 2019-10-02 DIAGNOSIS — K760 Fatty (change of) liver, not elsewhere classified: Secondary | ICD-10-CM | POA: Diagnosis not present

## 2019-10-02 DIAGNOSIS — Z7689 Persons encountering health services in other specified circumstances: Secondary | ICD-10-CM | POA: Diagnosis not present

## 2019-10-02 DIAGNOSIS — E7849 Other hyperlipidemia: Secondary | ICD-10-CM | POA: Diagnosis not present

## 2019-10-02 DIAGNOSIS — I10 Essential (primary) hypertension: Secondary | ICD-10-CM | POA: Diagnosis not present

## 2019-10-02 DIAGNOSIS — E1129 Type 2 diabetes mellitus with other diabetic kidney complication: Secondary | ICD-10-CM | POA: Diagnosis not present

## 2019-11-13 DIAGNOSIS — Z23 Encounter for immunization: Secondary | ICD-10-CM | POA: Diagnosis not present

## 2019-12-10 DIAGNOSIS — Z23 Encounter for immunization: Secondary | ICD-10-CM | POA: Diagnosis not present

## 2020-02-08 DIAGNOSIS — F418 Other specified anxiety disorders: Secondary | ICD-10-CM | POA: Diagnosis not present

## 2020-02-08 DIAGNOSIS — Z1389 Encounter for screening for other disorder: Secondary | ICD-10-CM | POA: Diagnosis not present

## 2020-02-08 DIAGNOSIS — I700 Atherosclerosis of aorta: Secondary | ICD-10-CM | POA: Diagnosis not present

## 2020-02-08 DIAGNOSIS — N1831 Chronic kidney disease, stage 3a: Secondary | ICD-10-CM | POA: Diagnosis not present

## 2020-02-08 DIAGNOSIS — E1129 Type 2 diabetes mellitus with other diabetic kidney complication: Secondary | ICD-10-CM | POA: Diagnosis not present

## 2020-02-10 ENCOUNTER — Other Ambulatory Visit (HOSPITAL_COMMUNITY): Payer: Self-pay | Admitting: Endocrinology

## 2020-02-10 DIAGNOSIS — I359 Nonrheumatic aortic valve disorder, unspecified: Secondary | ICD-10-CM

## 2020-03-10 ENCOUNTER — Ambulatory Visit (HOSPITAL_COMMUNITY): Payer: BC Managed Care – PPO | Attending: Cardiology

## 2020-03-10 ENCOUNTER — Other Ambulatory Visit: Payer: Self-pay

## 2020-03-10 DIAGNOSIS — I359 Nonrheumatic aortic valve disorder, unspecified: Secondary | ICD-10-CM | POA: Diagnosis not present

## 2020-03-10 LAB — ECHOCARDIOGRAM COMPLETE
Area-P 1/2: 8.25 cm2
S' Lateral: 2.7 cm

## 2020-03-10 MED ORDER — PERFLUTREN LIPID MICROSPHERE
1.0000 mL | INTRAVENOUS | Status: AC | PRN
  Administered 2020-03-10: 1 mL via INTRAVENOUS

## 2020-06-16 DIAGNOSIS — E785 Hyperlipidemia, unspecified: Secondary | ICD-10-CM | POA: Diagnosis not present

## 2020-06-16 DIAGNOSIS — C8590 Non-Hodgkin lymphoma, unspecified, unspecified site: Secondary | ICD-10-CM | POA: Diagnosis not present

## 2020-06-16 DIAGNOSIS — I700 Atherosclerosis of aorta: Secondary | ICD-10-CM | POA: Diagnosis not present

## 2020-06-16 DIAGNOSIS — E1129 Type 2 diabetes mellitus with other diabetic kidney complication: Secondary | ICD-10-CM | POA: Diagnosis not present

## 2020-10-10 DIAGNOSIS — E119 Type 2 diabetes mellitus without complications: Secondary | ICD-10-CM | POA: Diagnosis not present

## 2020-10-10 LAB — HM DIABETES EYE EXAM

## 2020-10-19 DIAGNOSIS — E1129 Type 2 diabetes mellitus with other diabetic kidney complication: Secondary | ICD-10-CM | POA: Diagnosis not present

## 2020-10-19 DIAGNOSIS — N1831 Chronic kidney disease, stage 3a: Secondary | ICD-10-CM | POA: Diagnosis not present

## 2020-10-24 ENCOUNTER — Encounter: Payer: Self-pay | Admitting: Family Medicine

## 2021-03-03 DIAGNOSIS — I10 Essential (primary) hypertension: Secondary | ICD-10-CM | POA: Diagnosis not present

## 2021-03-03 DIAGNOSIS — N1831 Chronic kidney disease, stage 3a: Secondary | ICD-10-CM | POA: Diagnosis not present

## 2021-03-03 DIAGNOSIS — E1129 Type 2 diabetes mellitus with other diabetic kidney complication: Secondary | ICD-10-CM | POA: Diagnosis not present

## 2021-03-03 LAB — HEMOGLOBIN A1C: Hemoglobin A1C: 6.7

## 2021-03-28 ENCOUNTER — Other Ambulatory Visit: Payer: Self-pay

## 2021-03-28 ENCOUNTER — Ambulatory Visit: Payer: BC Managed Care – PPO | Admitting: Family Medicine

## 2021-03-28 ENCOUNTER — Ambulatory Visit
Admission: RE | Admit: 2021-03-28 | Discharge: 2021-03-28 | Disposition: A | Discharge status: Home / Self Care | Payer: BC Managed Care – PPO | Source: Ambulatory Visit | Attending: Family Medicine | Admitting: Family Medicine

## 2021-03-28 ENCOUNTER — Encounter: Payer: Self-pay | Admitting: Family Medicine

## 2021-03-28 VITALS — BP 118/72 | HR 80 | Temp 98.9°F | Ht 65.5 in | Wt 170.8 lb

## 2021-03-28 DIAGNOSIS — Z6379 Other stressful life events affecting family and household: Secondary | ICD-10-CM | POA: Diagnosis not present

## 2021-03-28 DIAGNOSIS — M79672 Pain in left foot: Secondary | ICD-10-CM | POA: Diagnosis not present

## 2021-03-28 DIAGNOSIS — M7732 Calcaneal spur, left foot: Secondary | ICD-10-CM | POA: Diagnosis not present

## 2021-03-28 NOTE — Progress Notes (Signed)
   Subjective:    Patient ID: Rachel Santiago, female    DOB: [birth year], 60 y.o.   MRN: [id number]  HPI She complains of left midfoot pain over the last week or so that has been getting worse.  She has been involved in a walking program for the last 8 months.  She states her shoes fit her well and she is walking on paved surface.  She does walk daily for several miles. At the end of the encounter, she mentioned the fact that her mother-in-law is now living with them requiring help from her and her husband who eats her taking time out of the day to help care for her.  She has been using walking as a stress and tension reliever.  Apparently mother-in-law is difficult to work with.   Review of Systems     Objective:   Physical Exam Exam of the left foot shows full motion of the ankle.  Pulses are normal.  Normal strength.  Minimal discomfort on compression of the midfoot area.       Assessment & Plan:  Left foot pain - Plan: DG Foot Complete Left  Stress due to illness of family member I will do an x-ray to make sure there are no bony abnormalities.  Recommend she back off on walking for 1 week and then start back at half the intensity slowly increasing this.  Make sure the shoes have good arch supports.  She is also to take 2 Aleve twice per day for the next week or 2.  If she continues to have symptoms I explained that I would probably need to refer her to a foot specialist as this could be potentially an occult fracture I then discussed the stress that she is under helping to take care of her mother-in-law who apparently is difficult to deal with.  Strongly encouraged her to seek the counsel of an eldercare lawyer to discuss all options concerning finances and care for the mother-in-law. Greater than 30 minutes spent in counseling and coordination of care.

## 2021-03-29 ENCOUNTER — Ambulatory Visit: Payer: Self-pay | Admitting: Family Medicine

## 2021-03-29 NOTE — Progress Notes (Signed)
The blood work is normal 

## 2021-03-30 ENCOUNTER — Telehealth: Payer: Self-pay | Admitting: Family Medicine

## 2021-03-30 NOTE — Telephone Encounter (Signed)
Received requested records from Dr. Forde Dandy

## 2021-03-31 ENCOUNTER — Encounter: Payer: Self-pay | Admitting: Family Medicine

## 2021-08-03 ENCOUNTER — Encounter: Payer: Self-pay | Admitting: Internal Medicine

## 2021-09-14 ENCOUNTER — Other Ambulatory Visit (HOSPITAL_BASED_OUTPATIENT_CLINIC_OR_DEPARTMENT_OTHER): Payer: Self-pay

## 2021-09-14 MED ORDER — TRULICITY 3 MG/0.5ML ~~LOC~~ SOAJ
SUBCUTANEOUS | 3 refills | Status: DC
  Filled 2021-09-14: qty 2, 28d supply, fill #0
  Filled 2021-10-12: qty 2, 28d supply, fill #1
  Filled 2021-11-06: qty 2, 28d supply, fill #2
  Filled 2021-12-03: qty 2, 28d supply, fill #3
  Filled 2022-01-03: qty 2, 28d supply, fill #4
  Filled 2022-01-29: qty 2, 28d supply, fill #5
  Filled 2022-02-27: qty 2, 28d supply, fill #6
  Filled 2022-03-25: qty 2, 28d supply, fill #7
  Filled 2022-04-25: qty 2, 28d supply, fill #8

## 2021-10-12 ENCOUNTER — Other Ambulatory Visit (HOSPITAL_BASED_OUTPATIENT_CLINIC_OR_DEPARTMENT_OTHER): Payer: Self-pay

## 2021-11-07 ENCOUNTER — Other Ambulatory Visit (HOSPITAL_BASED_OUTPATIENT_CLINIC_OR_DEPARTMENT_OTHER): Payer: Self-pay

## 2021-12-04 ENCOUNTER — Other Ambulatory Visit (HOSPITAL_BASED_OUTPATIENT_CLINIC_OR_DEPARTMENT_OTHER): Payer: Self-pay

## 2022-01-03 ENCOUNTER — Other Ambulatory Visit (HOSPITAL_BASED_OUTPATIENT_CLINIC_OR_DEPARTMENT_OTHER): Payer: Self-pay

## 2022-01-29 ENCOUNTER — Other Ambulatory Visit (HOSPITAL_BASED_OUTPATIENT_CLINIC_OR_DEPARTMENT_OTHER): Payer: Self-pay

## 2022-02-27 ENCOUNTER — Other Ambulatory Visit (HOSPITAL_BASED_OUTPATIENT_CLINIC_OR_DEPARTMENT_OTHER): Payer: Self-pay

## 2022-03-26 ENCOUNTER — Other Ambulatory Visit (HOSPITAL_BASED_OUTPATIENT_CLINIC_OR_DEPARTMENT_OTHER): Payer: Self-pay

## 2022-04-25 ENCOUNTER — Encounter: Payer: Self-pay | Admitting: Internal Medicine

## 2022-04-26 ENCOUNTER — Other Ambulatory Visit (HOSPITAL_BASED_OUTPATIENT_CLINIC_OR_DEPARTMENT_OTHER): Payer: Self-pay

## 2022-05-24 ENCOUNTER — Other Ambulatory Visit (HOSPITAL_BASED_OUTPATIENT_CLINIC_OR_DEPARTMENT_OTHER): Payer: Self-pay

## 2022-05-24 MED ORDER — TRULICITY 3 MG/0.5ML ~~LOC~~ SOAJ
3.0000 mg | SUBCUTANEOUS | 3 refills | Status: DC
  Filled 2022-05-24: qty 2, 28d supply, fill #0
  Filled 2022-06-17: qty 2, 28d supply, fill #1
  Filled 2022-07-19: qty 2, 28d supply, fill #2
  Filled 2022-08-15: qty 2, 28d supply, fill #3
  Filled 2022-09-11: qty 2, 28d supply, fill #4
  Filled 2022-10-08: qty 2, 28d supply, fill #5
  Filled 2022-11-04 – 2022-11-06 (×3): qty 2, 28d supply, fill #6
  Filled 2022-12-03 – 2022-12-04 (×2): qty 2, 28d supply, fill #7
  Filled 2022-12-29 – 2023-01-01 (×2): qty 2, 28d supply, fill #8
  Filled 2023-01-26 – 2023-01-29 (×2): qty 2, 28d supply, fill #9
  Filled 2023-02-21: qty 2, 28d supply, fill #10
  Filled 2023-03-23: qty 2, 28d supply, fill #11

## 2022-05-29 ENCOUNTER — Encounter: Payer: Self-pay | Admitting: Internal Medicine

## 2022-06-11 ENCOUNTER — Encounter: Payer: Self-pay | Admitting: Internal Medicine

## 2022-06-13 ENCOUNTER — Other Ambulatory Visit: Payer: Self-pay | Admitting: Endocrinology

## 2022-06-13 DIAGNOSIS — Z1231 Encounter for screening mammogram for malignant neoplasm of breast: Secondary | ICD-10-CM

## 2022-06-18 ENCOUNTER — Other Ambulatory Visit (HOSPITAL_BASED_OUTPATIENT_CLINIC_OR_DEPARTMENT_OTHER): Payer: Self-pay

## 2022-07-19 ENCOUNTER — Other Ambulatory Visit (HOSPITAL_BASED_OUTPATIENT_CLINIC_OR_DEPARTMENT_OTHER): Payer: Self-pay

## 2022-07-20 ENCOUNTER — Other Ambulatory Visit (HOSPITAL_BASED_OUTPATIENT_CLINIC_OR_DEPARTMENT_OTHER): Payer: Self-pay

## 2022-07-24 ENCOUNTER — Other Ambulatory Visit (HOSPITAL_BASED_OUTPATIENT_CLINIC_OR_DEPARTMENT_OTHER): Payer: Self-pay

## 2022-07-25 ENCOUNTER — Other Ambulatory Visit (HOSPITAL_BASED_OUTPATIENT_CLINIC_OR_DEPARTMENT_OTHER): Payer: Self-pay

## 2022-08-03 ENCOUNTER — Ambulatory Visit
Admission: RE | Admit: 2022-08-03 | Discharge: 2022-08-03 | Disposition: A | Discharge status: Home / Self Care | Payer: BC Managed Care – PPO | Source: Ambulatory Visit | Attending: Endocrinology | Admitting: Endocrinology

## 2022-08-03 DIAGNOSIS — Z1231 Encounter for screening mammogram for malignant neoplasm of breast: Secondary | ICD-10-CM | POA: Diagnosis not present

## 2022-08-15 ENCOUNTER — Other Ambulatory Visit (HOSPITAL_BASED_OUTPATIENT_CLINIC_OR_DEPARTMENT_OTHER): Payer: Self-pay

## 2022-08-16 DIAGNOSIS — I700 Atherosclerosis of aorta: Secondary | ICD-10-CM | POA: Diagnosis not present

## 2022-08-16 DIAGNOSIS — E1129 Type 2 diabetes mellitus with other diabetic kidney complication: Secondary | ICD-10-CM | POA: Diagnosis not present

## 2022-08-23 ENCOUNTER — Other Ambulatory Visit: Payer: Self-pay | Admitting: Endocrinology

## 2022-08-23 DIAGNOSIS — Z1331 Encounter for screening for depression: Secondary | ICD-10-CM | POA: Diagnosis not present

## 2022-08-23 DIAGNOSIS — E1129 Type 2 diabetes mellitus with other diabetic kidney complication: Secondary | ICD-10-CM | POA: Diagnosis not present

## 2022-08-23 DIAGNOSIS — I129 Hypertensive chronic kidney disease with stage 1 through stage 4 chronic kidney disease, or unspecified chronic kidney disease: Secondary | ICD-10-CM | POA: Diagnosis not present

## 2022-08-23 DIAGNOSIS — R82998 Other abnormal findings in urine: Secondary | ICD-10-CM | POA: Diagnosis not present

## 2022-08-23 DIAGNOSIS — C8590 Non-Hodgkin lymphoma, unspecified, unspecified site: Secondary | ICD-10-CM | POA: Diagnosis not present

## 2022-08-23 DIAGNOSIS — K760 Fatty (change of) liver, not elsewhere classified: Secondary | ICD-10-CM

## 2022-08-23 DIAGNOSIS — Z1339 Encounter for screening examination for other mental health and behavioral disorders: Secondary | ICD-10-CM | POA: Diagnosis not present

## 2022-08-23 DIAGNOSIS — Z0000 Encounter for general adult medical examination without abnormal findings: Secondary | ICD-10-CM | POA: Diagnosis not present

## 2022-08-23 DIAGNOSIS — I10 Essential (primary) hypertension: Secondary | ICD-10-CM | POA: Diagnosis not present

## 2022-08-29 ENCOUNTER — Encounter: Payer: Self-pay | Admitting: Internal Medicine

## 2022-09-11 ENCOUNTER — Other Ambulatory Visit (HOSPITAL_BASED_OUTPATIENT_CLINIC_OR_DEPARTMENT_OTHER): Payer: Self-pay

## 2022-09-12 ENCOUNTER — Other Ambulatory Visit: Payer: Self-pay

## 2022-09-15 ENCOUNTER — Other Ambulatory Visit (HOSPITAL_BASED_OUTPATIENT_CLINIC_OR_DEPARTMENT_OTHER): Payer: Self-pay

## 2022-09-17 ENCOUNTER — Other Ambulatory Visit (HOSPITAL_BASED_OUTPATIENT_CLINIC_OR_DEPARTMENT_OTHER): Payer: Self-pay

## 2022-09-17 MED ORDER — MONTELUKAST SODIUM 10 MG PO TABS
10.0000 mg | ORAL_TABLET | Freq: Every day | ORAL | 3 refills | Status: DC
  Filled 2022-09-17: qty 90, 90d supply, fill #0
  Filled 2022-12-16: qty 90, 90d supply, fill #1
  Filled 2023-03-13: qty 90, 90d supply, fill #2
  Filled 2023-06-19: qty 90, 90d supply, fill #3

## 2022-09-19 ENCOUNTER — Other Ambulatory Visit (HOSPITAL_BASED_OUTPATIENT_CLINIC_OR_DEPARTMENT_OTHER): Payer: Self-pay

## 2022-09-19 MED ORDER — BENAZEPRIL HCL 10 MG PO TABS
10.0000 mg | ORAL_TABLET | Freq: Every day | ORAL | 3 refills | Status: DC
  Filled 2022-09-19: qty 90, 90d supply, fill #0

## 2022-09-20 ENCOUNTER — Encounter: Payer: Self-pay | Admitting: Family Medicine

## 2022-09-20 ENCOUNTER — Ambulatory Visit (INDEPENDENT_AMBULATORY_CARE_PROVIDER_SITE_OTHER): Payer: BC Managed Care – PPO | Admitting: Family Medicine

## 2022-09-20 ENCOUNTER — Other Ambulatory Visit (HOSPITAL_BASED_OUTPATIENT_CLINIC_OR_DEPARTMENT_OTHER): Payer: Self-pay

## 2022-09-20 VITALS — BP 130/64 | HR 99 | Temp 97.7°F | Wt 173.2 lb

## 2022-09-20 DIAGNOSIS — Z6379 Other stressful life events affecting family and household: Secondary | ICD-10-CM

## 2022-09-20 DIAGNOSIS — J4521 Mild intermittent asthma with (acute) exacerbation: Secondary | ICD-10-CM | POA: Diagnosis not present

## 2022-09-20 DIAGNOSIS — R011 Cardiac murmur, unspecified: Secondary | ICD-10-CM

## 2022-09-20 MED ORDER — AZITHROMYCIN 500 MG PO TABS
500.0000 mg | ORAL_TABLET | Freq: Every day | ORAL | 0 refills | Status: AC
  Filled 2022-09-20: qty 3, 3d supply, fill #0

## 2022-09-20 NOTE — Progress Notes (Signed)
   Subjective:    Patient ID: Rachel Santiago, female    DOB: May 09, 1961, 62 y.o.   MRN: [id number]  HPI She is here for evaluation of a 10-day history that started with nasal congestion, rhinorrhea, sneezing and itchy watery eyes.  She does have an underlying diagnosis of allergic rhinitis.  She then developed some wheezing some slight shortness of breath but no fever or chills.  She is using an inhaler.  She did have a negative COVID test.  She is also dealing with her 40 year old mother-in-law who has been living with him for the last 4 years.  They seem to be handling the situation well but is certainly interfering with her life but she seems to be handling this fairly well.  Review of Systems     Objective:   Physical Exam Alert and in no distress. Tympanic membranes and canals are normal. Pharyngeal area is normal. Neck is supple without adenopathy or thyromegaly. Cardiac exam shows a regular sinus rhythm without murmurs or gallops. Lungs show scattered wheezing        Assessment & Plan:  Mild intermittent asthmatic bronchitis with acute exacerbation - Plan: azithromycin (ZITHROMAX) 500 MG tablet  Systolic murmur - Plan: ECHOCARDIOGRAM COMPLETE  Stress due to illness of family member I will place her on azithromycin.  Instructed her to call me in 7 to 10 days if not back to normal. Will also get a echocardiogram to look at the murmur.

## 2022-10-02 DIAGNOSIS — E119 Type 2 diabetes mellitus without complications: Secondary | ICD-10-CM | POA: Diagnosis not present

## 2022-10-03 ENCOUNTER — Other Ambulatory Visit (HOSPITAL_BASED_OUTPATIENT_CLINIC_OR_DEPARTMENT_OTHER): Payer: Self-pay

## 2022-10-03 MED ORDER — FENOFIBRATE 160 MG PO TABS
160.0000 mg | ORAL_TABLET | Freq: Every day | ORAL | 0 refills | Status: DC
  Filled 2022-10-03: qty 90, 90d supply, fill #0

## 2022-10-03 MED ORDER — SIMVASTATIN 40 MG PO TABS
40.0000 mg | ORAL_TABLET | Freq: Every day | ORAL | 0 refills | Status: DC
  Filled 2022-10-03: qty 90, 90d supply, fill #0

## 2022-10-03 MED ORDER — INSULIN GLARGINE (1 UNIT DIAL) 300 UNIT/ML ~~LOC~~ SOPN
125.0000 [IU] | PEN_INJECTOR | Freq: Every day | SUBCUTANEOUS | 5 refills | Status: DC
  Filled 2022-10-03: qty 12, 29d supply, fill #0

## 2022-10-03 MED ORDER — ICOSAPENT ETHYL 1 G PO CAPS
2.0000 g | ORAL_CAPSULE | Freq: Two times a day (BID) | ORAL | 2 refills | Status: DC
  Filled 2022-10-03 (×2): qty 360, 90d supply, fill #0

## 2022-10-03 MED ORDER — SYNJARDY XR 5-1000 MG PO TB24
1.0000 | ORAL_TABLET | Freq: Two times a day (BID) | ORAL | 1 refills | Status: DC
  Filled 2022-10-03 (×2): qty 180, 90d supply, fill #0

## 2022-10-03 MED ORDER — BENAZEPRIL HCL 10 MG PO TABS
10.0000 mg | ORAL_TABLET | Freq: Every day | ORAL | 3 refills | Status: DC
  Filled 2022-10-03: qty 90, 90d supply, fill #0

## 2022-10-04 ENCOUNTER — Other Ambulatory Visit: Payer: Self-pay

## 2022-10-04 ENCOUNTER — Other Ambulatory Visit (HOSPITAL_BASED_OUTPATIENT_CLINIC_OR_DEPARTMENT_OTHER): Payer: Self-pay

## 2022-10-04 MED ORDER — INSULIN GLARGINE (1 UNIT DIAL) 300 UNIT/ML ~~LOC~~ SOPN
125.0000 [IU] | PEN_INJECTOR | Freq: Every day | SUBCUTANEOUS | 6 refills | Status: DC
  Filled 2022-10-04: qty 12, 29d supply, fill #0
  Filled 2022-11-04 – 2022-11-05 (×2): qty 12, 29d supply, fill #1
  Filled 2022-11-30: qty 12, 29d supply, fill #2

## 2022-10-04 MED ORDER — ICOSAPENT ETHYL 1 G PO CAPS
2.0000 g | ORAL_CAPSULE | Freq: Two times a day (BID) | ORAL | 3 refills | Status: DC
  Filled 2022-10-08: qty 360, 90d supply, fill #0
  Filled 2023-01-13: qty 360, 90d supply, fill #1
  Filled 2023-03-29: qty 4, 1d supply, fill #2
  Filled 2023-04-10: qty 360, 90d supply, fill #2

## 2022-10-04 MED ORDER — FENOFIBRATE 160 MG PO TABS
160.0000 mg | ORAL_TABLET | Freq: Every day | ORAL | 0 refills | Status: DC
  Filled 2022-10-19: qty 90, 90d supply, fill #0

## 2022-10-04 MED ORDER — SIMVASTATIN 40 MG PO TABS
40.0000 mg | ORAL_TABLET | Freq: Every day | ORAL | 3 refills | Status: DC
  Filled 2022-10-15: qty 90, 90d supply, fill #0

## 2022-10-04 MED ORDER — BENAZEPRIL HCL 10 MG PO TABS
10.0000 mg | ORAL_TABLET | Freq: Every day | ORAL | 3 refills | Status: DC
  Filled 2022-10-04 – 2022-11-30 (×2): qty 90, 90d supply, fill #0

## 2022-10-04 MED ORDER — SYNJARDY 5-1000 MG PO TABS
1.0000 | ORAL_TABLET | Freq: Two times a day (BID) | ORAL | 3 refills | Status: DC
  Filled 2022-10-04: qty 180, 90d supply, fill #0
  Filled 2022-11-30: qty 180, 90d supply, fill #1

## 2022-10-08 ENCOUNTER — Other Ambulatory Visit (HOSPITAL_BASED_OUTPATIENT_CLINIC_OR_DEPARTMENT_OTHER): Payer: Self-pay

## 2022-10-09 ENCOUNTER — Other Ambulatory Visit (HOSPITAL_BASED_OUTPATIENT_CLINIC_OR_DEPARTMENT_OTHER): Payer: Self-pay

## 2022-10-11 ENCOUNTER — Other Ambulatory Visit (HOSPITAL_BASED_OUTPATIENT_CLINIC_OR_DEPARTMENT_OTHER): Payer: Self-pay

## 2022-10-15 ENCOUNTER — Other Ambulatory Visit (HOSPITAL_BASED_OUTPATIENT_CLINIC_OR_DEPARTMENT_OTHER): Payer: Self-pay

## 2022-10-15 ENCOUNTER — Other Ambulatory Visit: Payer: Self-pay

## 2022-10-18 ENCOUNTER — Other Ambulatory Visit (HOSPITAL_BASED_OUTPATIENT_CLINIC_OR_DEPARTMENT_OTHER): Payer: Self-pay

## 2022-10-20 ENCOUNTER — Other Ambulatory Visit (HOSPITAL_BASED_OUTPATIENT_CLINIC_OR_DEPARTMENT_OTHER): Payer: Self-pay

## 2022-11-05 ENCOUNTER — Encounter (HOSPITAL_BASED_OUTPATIENT_CLINIC_OR_DEPARTMENT_OTHER): Payer: Self-pay | Admitting: Pharmacist

## 2022-11-05 ENCOUNTER — Other Ambulatory Visit (HOSPITAL_BASED_OUTPATIENT_CLINIC_OR_DEPARTMENT_OTHER): Payer: Self-pay

## 2022-11-05 ENCOUNTER — Other Ambulatory Visit: Payer: Self-pay

## 2022-11-06 ENCOUNTER — Other Ambulatory Visit (HOSPITAL_BASED_OUTPATIENT_CLINIC_OR_DEPARTMENT_OTHER): Payer: Self-pay

## 2022-11-30 ENCOUNTER — Other Ambulatory Visit (HOSPITAL_BASED_OUTPATIENT_CLINIC_OR_DEPARTMENT_OTHER): Payer: Self-pay

## 2022-12-01 ENCOUNTER — Other Ambulatory Visit (HOSPITAL_BASED_OUTPATIENT_CLINIC_OR_DEPARTMENT_OTHER): Payer: Self-pay

## 2022-12-01 MED ORDER — FENOFIBRATE 160 MG PO TABS
160.0000 mg | ORAL_TABLET | Freq: Every day | ORAL | 4 refills | Status: DC
  Filled 2022-12-01 – 2023-01-13 (×2): qty 90, 90d supply, fill #0
  Filled 2023-04-13: qty 90, 90d supply, fill #1
  Filled 2023-07-12: qty 90, 90d supply, fill #2
  Filled 2023-10-08: qty 90, 90d supply, fill #3

## 2022-12-01 MED ORDER — SIMVASTATIN 40 MG PO TABS
40.0000 mg | ORAL_TABLET | Freq: Every day | ORAL | 3 refills | Status: DC
  Filled 2022-12-01: qty 90, 90d supply, fill #0
  Filled 2023-04-13: qty 90, 90d supply, fill #1
  Filled 2023-07-12: qty 90, 90d supply, fill #2
  Filled 2023-10-08: qty 90, 90d supply, fill #3

## 2022-12-03 ENCOUNTER — Other Ambulatory Visit: Payer: Self-pay

## 2022-12-03 ENCOUNTER — Other Ambulatory Visit (HOSPITAL_BASED_OUTPATIENT_CLINIC_OR_DEPARTMENT_OTHER): Payer: Self-pay

## 2022-12-04 ENCOUNTER — Other Ambulatory Visit: Payer: Self-pay

## 2022-12-05 ENCOUNTER — Other Ambulatory Visit (HOSPITAL_BASED_OUTPATIENT_CLINIC_OR_DEPARTMENT_OTHER): Payer: Self-pay

## 2022-12-06 ENCOUNTER — Other Ambulatory Visit (HOSPITAL_COMMUNITY): Payer: Self-pay

## 2022-12-06 ENCOUNTER — Other Ambulatory Visit (HOSPITAL_BASED_OUTPATIENT_CLINIC_OR_DEPARTMENT_OTHER): Payer: Self-pay

## 2022-12-06 MED ORDER — TOUJEO SOLOSTAR 300 UNIT/ML ~~LOC~~ SOPN
125.0000 [IU] | PEN_INJECTOR | Freq: Every day | SUBCUTANEOUS | 3 refills | Status: DC
  Filled 2022-12-29: qty 36, 90d supply, fill #0
  Filled 2023-03-29: qty 36, 86d supply, fill #1
  Filled 2023-06-18 – 2023-06-19 (×2): qty 36, 86d supply, fill #2
  Filled 2023-09-13: qty 36, 86d supply, fill #3

## 2022-12-17 ENCOUNTER — Other Ambulatory Visit (HOSPITAL_BASED_OUTPATIENT_CLINIC_OR_DEPARTMENT_OTHER): Payer: Self-pay

## 2022-12-17 DIAGNOSIS — I700 Atherosclerosis of aorta: Secondary | ICD-10-CM | POA: Diagnosis not present

## 2022-12-17 DIAGNOSIS — N1831 Chronic kidney disease, stage 3a: Secondary | ICD-10-CM | POA: Diagnosis not present

## 2022-12-17 DIAGNOSIS — E785 Hyperlipidemia, unspecified: Secondary | ICD-10-CM | POA: Diagnosis not present

## 2022-12-17 DIAGNOSIS — E1129 Type 2 diabetes mellitus with other diabetic kidney complication: Secondary | ICD-10-CM | POA: Diagnosis not present

## 2022-12-17 MED ORDER — ONETOUCH VERIO VI STRP
ORAL_STRIP | 3 refills | Status: AC
  Filled 2022-12-17: qty 250, 83d supply, fill #0
  Filled 2023-03-29: qty 300, 90d supply, fill #1
  Filled 2023-07-12 – 2023-09-13 (×2): qty 300, 90d supply, fill #2

## 2022-12-17 MED ORDER — TECHLITE PEN NEEDLES 32G X 4 MM MISC
1.0000 | Freq: Every day | 3 refills | Status: DC
  Filled 2022-12-17: qty 100, 90d supply, fill #0
  Filled 2023-04-10: qty 100, 90d supply, fill #1
  Filled 2023-07-12 – 2023-09-03 (×2): qty 100, 90d supply, fill #2
  Filled 2023-12-15: qty 100, 90d supply, fill #3

## 2022-12-18 ENCOUNTER — Other Ambulatory Visit (HOSPITAL_BASED_OUTPATIENT_CLINIC_OR_DEPARTMENT_OTHER): Payer: Self-pay

## 2022-12-25 ENCOUNTER — Ambulatory Visit
Admission: RE | Admit: 2022-12-25 | Discharge: 2022-12-25 | Disposition: A | Discharge status: Home / Self Care | Payer: BC Managed Care – PPO | Source: Ambulatory Visit | Attending: Endocrinology | Admitting: Endocrinology

## 2022-12-25 DIAGNOSIS — K760 Fatty (change of) liver, not elsewhere classified: Secondary | ICD-10-CM | POA: Diagnosis not present

## 2022-12-29 ENCOUNTER — Other Ambulatory Visit (HOSPITAL_BASED_OUTPATIENT_CLINIC_OR_DEPARTMENT_OTHER): Payer: Self-pay

## 2022-12-30 ENCOUNTER — Other Ambulatory Visit (HOSPITAL_BASED_OUTPATIENT_CLINIC_OR_DEPARTMENT_OTHER): Payer: Self-pay

## 2022-12-31 ENCOUNTER — Other Ambulatory Visit (HOSPITAL_BASED_OUTPATIENT_CLINIC_OR_DEPARTMENT_OTHER): Payer: Self-pay

## 2023-01-01 ENCOUNTER — Encounter (HOSPITAL_BASED_OUTPATIENT_CLINIC_OR_DEPARTMENT_OTHER): Payer: Self-pay | Admitting: Pharmacist

## 2023-01-01 ENCOUNTER — Other Ambulatory Visit (HOSPITAL_BASED_OUTPATIENT_CLINIC_OR_DEPARTMENT_OTHER): Payer: Self-pay

## 2023-01-13 ENCOUNTER — Other Ambulatory Visit (HOSPITAL_BASED_OUTPATIENT_CLINIC_OR_DEPARTMENT_OTHER): Payer: Self-pay

## 2023-01-13 MED ORDER — SYNJARDY 5-1000 MG PO TABS
1.0000 | ORAL_TABLET | Freq: Two times a day (BID) | ORAL | 4 refills | Status: DC
  Filled 2023-01-13: qty 180, 90d supply, fill #0
  Filled 2023-04-13: qty 180, 90d supply, fill #1
  Filled 2023-07-12: qty 180, 90d supply, fill #2
  Filled 2023-10-08: qty 180, 90d supply, fill #3
  Filled 2024-01-06: qty 180, 90d supply, fill #4

## 2023-01-13 MED ORDER — BENAZEPRIL HCL 10 MG PO TABS
10.0000 mg | ORAL_TABLET | Freq: Every day | ORAL | 4 refills | Status: DC
  Filled 2023-01-13 – 2023-03-23 (×2): qty 90, 90d supply, fill #0
  Filled 2023-06-19: qty 90, 90d supply, fill #1
  Filled 2023-08-08: qty 90, 90d supply, fill #2
  Filled 2023-12-09: qty 90, 90d supply, fill #3

## 2023-01-14 ENCOUNTER — Other Ambulatory Visit: Payer: Self-pay

## 2023-01-15 ENCOUNTER — Other Ambulatory Visit (HOSPITAL_BASED_OUTPATIENT_CLINIC_OR_DEPARTMENT_OTHER): Payer: Self-pay

## 2023-01-16 ENCOUNTER — Other Ambulatory Visit: Payer: Self-pay

## 2023-01-16 ENCOUNTER — Other Ambulatory Visit (HOSPITAL_BASED_OUTPATIENT_CLINIC_OR_DEPARTMENT_OTHER): Payer: Self-pay

## 2023-01-17 ENCOUNTER — Other Ambulatory Visit (HOSPITAL_BASED_OUTPATIENT_CLINIC_OR_DEPARTMENT_OTHER): Payer: Self-pay

## 2023-01-27 ENCOUNTER — Other Ambulatory Visit (HOSPITAL_BASED_OUTPATIENT_CLINIC_OR_DEPARTMENT_OTHER): Payer: Self-pay

## 2023-02-28 ENCOUNTER — Other Ambulatory Visit (HOSPITAL_BASED_OUTPATIENT_CLINIC_OR_DEPARTMENT_OTHER): Payer: Self-pay

## 2023-03-23 ENCOUNTER — Other Ambulatory Visit (HOSPITAL_BASED_OUTPATIENT_CLINIC_OR_DEPARTMENT_OTHER): Payer: Self-pay

## 2023-03-29 ENCOUNTER — Other Ambulatory Visit: Payer: Self-pay

## 2023-03-29 ENCOUNTER — Other Ambulatory Visit (HOSPITAL_BASED_OUTPATIENT_CLINIC_OR_DEPARTMENT_OTHER): Payer: Self-pay

## 2023-03-31 ENCOUNTER — Other Ambulatory Visit (HOSPITAL_BASED_OUTPATIENT_CLINIC_OR_DEPARTMENT_OTHER): Payer: Self-pay

## 2023-04-01 ENCOUNTER — Other Ambulatory Visit (HOSPITAL_BASED_OUTPATIENT_CLINIC_OR_DEPARTMENT_OTHER): Payer: Self-pay

## 2023-04-02 ENCOUNTER — Other Ambulatory Visit (HOSPITAL_BASED_OUTPATIENT_CLINIC_OR_DEPARTMENT_OTHER): Payer: Self-pay

## 2023-04-03 ENCOUNTER — Other Ambulatory Visit (HOSPITAL_BASED_OUTPATIENT_CLINIC_OR_DEPARTMENT_OTHER): Payer: Self-pay

## 2023-04-12 DIAGNOSIS — I129 Hypertensive chronic kidney disease with stage 1 through stage 4 chronic kidney disease, or unspecified chronic kidney disease: Secondary | ICD-10-CM | POA: Diagnosis not present

## 2023-04-12 DIAGNOSIS — E1129 Type 2 diabetes mellitus with other diabetic kidney complication: Secondary | ICD-10-CM | POA: Diagnosis not present

## 2023-04-14 ENCOUNTER — Other Ambulatory Visit (HOSPITAL_BASED_OUTPATIENT_CLINIC_OR_DEPARTMENT_OTHER): Payer: Self-pay

## 2023-04-15 ENCOUNTER — Other Ambulatory Visit: Payer: Self-pay

## 2023-04-23 ENCOUNTER — Other Ambulatory Visit (HOSPITAL_BASED_OUTPATIENT_CLINIC_OR_DEPARTMENT_OTHER): Payer: Self-pay

## 2023-04-23 MED ORDER — TRULICITY 3 MG/0.5ML ~~LOC~~ SOAJ
3.0000 mg | SUBCUTANEOUS | 3 refills | Status: DC
  Filled 2023-04-23: qty 6, 84d supply, fill #0
  Filled 2023-07-12: qty 6, 84d supply, fill #1
  Filled 2023-10-02: qty 6, 84d supply, fill #2
  Filled 2023-12-15: qty 6, 84d supply, fill #3

## 2023-04-24 ENCOUNTER — Other Ambulatory Visit: Payer: Self-pay

## 2023-04-24 ENCOUNTER — Other Ambulatory Visit (HOSPITAL_BASED_OUTPATIENT_CLINIC_OR_DEPARTMENT_OTHER): Payer: Self-pay

## 2023-04-25 ENCOUNTER — Other Ambulatory Visit (HOSPITAL_COMMUNITY): Payer: Self-pay

## 2023-05-01 DIAGNOSIS — D239 Other benign neoplasm of skin, unspecified: Secondary | ICD-10-CM | POA: Diagnosis not present

## 2023-05-01 DIAGNOSIS — L723 Sebaceous cyst: Secondary | ICD-10-CM | POA: Diagnosis not present

## 2023-05-01 DIAGNOSIS — C44619 Basal cell carcinoma of skin of left upper limb, including shoulder: Secondary | ICD-10-CM | POA: Diagnosis not present

## 2023-05-01 DIAGNOSIS — C44719 Basal cell carcinoma of skin of left lower limb, including hip: Secondary | ICD-10-CM | POA: Diagnosis not present

## 2023-05-01 DIAGNOSIS — D485 Neoplasm of uncertain behavior of skin: Secondary | ICD-10-CM | POA: Diagnosis not present

## 2023-05-27 ENCOUNTER — Other Ambulatory Visit (HOSPITAL_BASED_OUTPATIENT_CLINIC_OR_DEPARTMENT_OTHER): Payer: Self-pay

## 2023-05-27 MED ORDER — LAGEVRIO 200 MG PO CAPS
4.0000 | ORAL_CAPSULE | Freq: Two times a day (BID) | ORAL | 0 refills | Status: AC
  Filled 2023-05-27: qty 40, 5d supply, fill #0

## 2023-06-05 DIAGNOSIS — C44619 Basal cell carcinoma of skin of left upper limb, including shoulder: Secondary | ICD-10-CM | POA: Diagnosis not present

## 2023-06-18 ENCOUNTER — Other Ambulatory Visit (HOSPITAL_BASED_OUTPATIENT_CLINIC_OR_DEPARTMENT_OTHER): Payer: Self-pay

## 2023-06-18 ENCOUNTER — Other Ambulatory Visit: Payer: Self-pay

## 2023-06-19 ENCOUNTER — Other Ambulatory Visit (HOSPITAL_BASED_OUTPATIENT_CLINIC_OR_DEPARTMENT_OTHER): Payer: Self-pay

## 2023-06-19 ENCOUNTER — Other Ambulatory Visit: Payer: Self-pay

## 2023-06-19 DIAGNOSIS — C44719 Basal cell carcinoma of skin of left lower limb, including hip: Secondary | ICD-10-CM | POA: Diagnosis not present

## 2023-06-20 ENCOUNTER — Other Ambulatory Visit (HOSPITAL_BASED_OUTPATIENT_CLINIC_OR_DEPARTMENT_OTHER): Payer: Self-pay

## 2023-06-20 ENCOUNTER — Other Ambulatory Visit: Payer: Self-pay

## 2023-06-20 MED ORDER — FLULAVAL 0.5 ML IM SUSY
0.5000 mL | PREFILLED_SYRINGE | Freq: Once | INTRAMUSCULAR | 0 refills | Status: AC
  Filled 2023-06-20: qty 0.5, 1d supply, fill #0

## 2023-06-21 ENCOUNTER — Other Ambulatory Visit (HOSPITAL_BASED_OUTPATIENT_CLINIC_OR_DEPARTMENT_OTHER): Payer: Self-pay

## 2023-06-21 ENCOUNTER — Other Ambulatory Visit: Payer: Self-pay

## 2023-06-22 ENCOUNTER — Other Ambulatory Visit (HOSPITAL_BASED_OUTPATIENT_CLINIC_OR_DEPARTMENT_OTHER): Payer: Self-pay

## 2023-06-23 ENCOUNTER — Other Ambulatory Visit (HOSPITAL_BASED_OUTPATIENT_CLINIC_OR_DEPARTMENT_OTHER): Payer: Self-pay

## 2023-06-27 ENCOUNTER — Other Ambulatory Visit: Payer: Self-pay

## 2023-07-09 ENCOUNTER — Other Ambulatory Visit: Payer: Self-pay

## 2023-07-12 ENCOUNTER — Other Ambulatory Visit (HOSPITAL_BASED_OUTPATIENT_CLINIC_OR_DEPARTMENT_OTHER): Payer: Self-pay

## 2023-07-12 ENCOUNTER — Other Ambulatory Visit: Payer: Self-pay

## 2023-07-15 ENCOUNTER — Other Ambulatory Visit (HOSPITAL_BASED_OUTPATIENT_CLINIC_OR_DEPARTMENT_OTHER): Payer: Self-pay

## 2023-07-19 ENCOUNTER — Other Ambulatory Visit (HOSPITAL_BASED_OUTPATIENT_CLINIC_OR_DEPARTMENT_OTHER): Payer: Self-pay

## 2023-07-22 ENCOUNTER — Other Ambulatory Visit (HOSPITAL_BASED_OUTPATIENT_CLINIC_OR_DEPARTMENT_OTHER): Payer: Self-pay

## 2023-07-22 MED ORDER — ICOSAPENT ETHYL 1 G PO CAPS
2.0000 g | ORAL_CAPSULE | Freq: Two times a day (BID) | ORAL | 3 refills | Status: AC
  Filled 2023-07-22: qty 360, 90d supply, fill #0

## 2023-07-25 ENCOUNTER — Other Ambulatory Visit: Payer: Self-pay

## 2023-07-26 ENCOUNTER — Other Ambulatory Visit (HOSPITAL_BASED_OUTPATIENT_CLINIC_OR_DEPARTMENT_OTHER): Payer: Self-pay

## 2023-07-28 ENCOUNTER — Other Ambulatory Visit (HOSPITAL_BASED_OUTPATIENT_CLINIC_OR_DEPARTMENT_OTHER): Payer: Self-pay

## 2023-07-31 ENCOUNTER — Other Ambulatory Visit (HOSPITAL_BASED_OUTPATIENT_CLINIC_OR_DEPARTMENT_OTHER): Payer: Self-pay

## 2023-07-31 ENCOUNTER — Other Ambulatory Visit: Payer: Self-pay

## 2023-08-05 ENCOUNTER — Encounter: Payer: Self-pay | Admitting: Family Medicine

## 2023-08-05 NOTE — Telephone Encounter (Signed)
 Care team updated and letter sent for eye exam notes.

## 2023-08-08 ENCOUNTER — Other Ambulatory Visit (HOSPITAL_BASED_OUTPATIENT_CLINIC_OR_DEPARTMENT_OTHER): Payer: Self-pay

## 2023-09-02 ENCOUNTER — Other Ambulatory Visit (HOSPITAL_COMMUNITY): Payer: Self-pay

## 2023-09-03 ENCOUNTER — Other Ambulatory Visit (HOSPITAL_BASED_OUTPATIENT_CLINIC_OR_DEPARTMENT_OTHER): Payer: Self-pay

## 2023-09-03 MED ORDER — MONTELUKAST SODIUM 10 MG PO TABS
10.0000 mg | ORAL_TABLET | Freq: Every day | ORAL | 3 refills | Status: DC
  Filled 2023-09-03: qty 90, 90d supply, fill #0
  Filled 2023-12-15: qty 90, 90d supply, fill #1
  Filled 2024-03-10: qty 90, 90d supply, fill #2
  Filled 2024-06-01: qty 90, 90d supply, fill #3

## 2023-09-13 ENCOUNTER — Other Ambulatory Visit (HOSPITAL_BASED_OUTPATIENT_CLINIC_OR_DEPARTMENT_OTHER): Payer: Self-pay

## 2023-09-13 ENCOUNTER — Other Ambulatory Visit: Payer: Self-pay

## 2023-09-16 ENCOUNTER — Other Ambulatory Visit (HOSPITAL_BASED_OUTPATIENT_CLINIC_OR_DEPARTMENT_OTHER): Payer: Self-pay

## 2023-09-16 ENCOUNTER — Other Ambulatory Visit: Payer: Self-pay

## 2023-09-18 ENCOUNTER — Other Ambulatory Visit (HOSPITAL_COMMUNITY): Payer: Self-pay

## 2023-10-02 ENCOUNTER — Other Ambulatory Visit (HOSPITAL_BASED_OUTPATIENT_CLINIC_OR_DEPARTMENT_OTHER): Payer: Self-pay

## 2023-10-02 ENCOUNTER — Encounter (HOSPITAL_BASED_OUTPATIENT_CLINIC_OR_DEPARTMENT_OTHER): Payer: Self-pay

## 2023-10-08 ENCOUNTER — Other Ambulatory Visit: Payer: Self-pay

## 2023-10-08 ENCOUNTER — Other Ambulatory Visit (HOSPITAL_BASED_OUTPATIENT_CLINIC_OR_DEPARTMENT_OTHER): Payer: Self-pay

## 2023-10-09 ENCOUNTER — Other Ambulatory Visit (HOSPITAL_BASED_OUTPATIENT_CLINIC_OR_DEPARTMENT_OTHER): Payer: Self-pay

## 2023-10-15 ENCOUNTER — Encounter: Payer: Self-pay | Admitting: Internal Medicine

## 2023-10-30 ENCOUNTER — Other Ambulatory Visit (HOSPITAL_BASED_OUTPATIENT_CLINIC_OR_DEPARTMENT_OTHER): Payer: Self-pay

## 2023-12-15 ENCOUNTER — Other Ambulatory Visit (HOSPITAL_BASED_OUTPATIENT_CLINIC_OR_DEPARTMENT_OTHER): Payer: Self-pay

## 2023-12-16 ENCOUNTER — Other Ambulatory Visit (HOSPITAL_BASED_OUTPATIENT_CLINIC_OR_DEPARTMENT_OTHER): Payer: Self-pay

## 2023-12-16 ENCOUNTER — Other Ambulatory Visit: Payer: Self-pay

## 2023-12-16 MED ORDER — TOUJEO SOLOSTAR 300 UNIT/ML ~~LOC~~ SOPN
125.0000 [IU] | PEN_INJECTOR | Freq: Every day | SUBCUTANEOUS | 12 refills | Status: AC
Start: 2023-12-16 — End: ?

## 2023-12-16 MED ORDER — LEVALBUTEROL TARTRATE 45 MCG/ACT IN AERO
1.0000 | INHALATION_SPRAY | Freq: Three times a day (TID) | RESPIRATORY_TRACT | 3 refills | Status: AC | PRN
Start: 2023-12-16 — End: ?
  Filled 2023-12-16: qty 15, 66d supply, fill #0

## 2023-12-16 MED ORDER — TOUJEO SOLOSTAR 300 UNIT/ML ~~LOC~~ SOPN
125.0000 [IU] | PEN_INJECTOR | Freq: Every day | SUBCUTANEOUS | 12 refills | Status: AC
  Filled 2023-12-16: qty 12, 29d supply, fill #0
  Filled 2024-01-23: qty 12, 29d supply, fill #1
  Filled 2024-02-18: qty 12, 29d supply, fill #2
  Filled 2024-03-19: qty 12, 29d supply, fill #3
  Filled 2024-04-17: qty 12, 29d supply, fill #4
  Filled 2024-05-14: qty 12, 29d supply, fill #5
  Filled 2024-06-22: qty 12, 29d supply, fill #6
  Filled 2024-07-29: qty 12, 29d supply, fill #7
  Filled 2024-08-25: qty 12, 29d supply, fill #8

## 2023-12-17 ENCOUNTER — Other Ambulatory Visit (HOSPITAL_BASED_OUTPATIENT_CLINIC_OR_DEPARTMENT_OTHER): Payer: Self-pay

## 2024-01-06 ENCOUNTER — Emergency Department (HOSPITAL_BASED_OUTPATIENT_CLINIC_OR_DEPARTMENT_OTHER)
Admission: EM | Admit: 2024-01-06 | Discharge: 2024-01-07 | Disposition: A | Discharge status: Home / Self Care | Attending: Emergency Medicine | Admitting: Emergency Medicine

## 2024-01-06 ENCOUNTER — Other Ambulatory Visit (HOSPITAL_BASED_OUTPATIENT_CLINIC_OR_DEPARTMENT_OTHER): Payer: Self-pay

## 2024-01-06 ENCOUNTER — Encounter (HOSPITAL_BASED_OUTPATIENT_CLINIC_OR_DEPARTMENT_OTHER): Payer: Self-pay

## 2024-01-06 DIAGNOSIS — E119 Type 2 diabetes mellitus without complications: Secondary | ICD-10-CM | POA: Diagnosis not present

## 2024-01-06 DIAGNOSIS — R112 Nausea with vomiting, unspecified: Secondary | ICD-10-CM | POA: Diagnosis present

## 2024-01-06 DIAGNOSIS — Z794 Long term (current) use of insulin: Secondary | ICD-10-CM | POA: Insufficient documentation

## 2024-01-06 DIAGNOSIS — J45909 Unspecified asthma, uncomplicated: Secondary | ICD-10-CM | POA: Diagnosis not present

## 2024-01-06 DIAGNOSIS — I10 Essential (primary) hypertension: Secondary | ICD-10-CM | POA: Insufficient documentation

## 2024-01-06 LAB — COMPREHENSIVE METABOLIC PANEL WITH GFR
ALT: 54 U/L — ABNORMAL HIGH (ref 0–44)
AST: 65 U/L — ABNORMAL HIGH (ref 15–41)
Albumin: 4.8 g/dL (ref 3.5–5.0)
Alkaline Phosphatase: 74 U/L (ref 38–126)
Anion gap: 18 — ABNORMAL HIGH (ref 5–15)
BUN: 25 mg/dL — ABNORMAL HIGH (ref 8–23)
CO2: 21 mmol/L — ABNORMAL LOW (ref 22–32)
Calcium: 10.7 mg/dL — ABNORMAL HIGH (ref 8.9–10.3)
Chloride: 102 mmol/L (ref 98–111)
Creatinine, Ser: 1.1 mg/dL — ABNORMAL HIGH (ref 0.44–1.00)
GFR, Estimated: 57 mL/min — ABNORMAL LOW (ref >60)
Glucose, Bld: 150 mg/dL — ABNORMAL HIGH (ref 70–99)
Potassium: 3.4 mmol/L — ABNORMAL LOW (ref 3.5–5.1)
Sodium: 141 mmol/L (ref 135–145)
Total Bilirubin: 0.4 mg/dL (ref 0.0–1.2)
Total Protein: 8 g/dL (ref 6.5–8.1)

## 2024-01-06 LAB — CBC
HCT: 41.1 % (ref 36.0–46.0)
Hemoglobin: 13.7 g/dL (ref 12.0–15.0)
MCH: 32 pg (ref 26.0–34.0)
MCHC: 33.3 g/dL (ref 30.0–36.0)
MCV: 96 fL (ref 80.0–100.0)
Platelets: 259 10*3/uL (ref 150–400)
RBC: 4.28 MIL/uL (ref 3.87–5.11)
RDW: 13.1 % (ref 11.5–15.5)
WBC: 6.8 10*3/uL (ref 4.0–10.5)
nRBC: 0 % (ref 0.0–0.2)

## 2024-01-06 LAB — LIPASE, BLOOD: Lipase: 84 U/L — ABNORMAL HIGH (ref 11–51)

## 2024-01-06 MED ORDER — FENOFIBRATE 160 MG PO TABS
160.0000 mg | ORAL_TABLET | Freq: Every day | ORAL | 4 refills | Status: AC
Start: 2024-01-06 — End: ?
  Filled 2024-01-06: qty 90, 90d supply, fill #0
  Filled 2024-04-05: qty 90, 90d supply, fill #1
  Filled 2024-07-04: qty 90, 90d supply, fill #2

## 2024-01-06 MED ORDER — SIMVASTATIN 40 MG PO TABS
40.0000 mg | ORAL_TABLET | Freq: Every day | ORAL | 3 refills | Status: AC
  Filled 2024-01-06: qty 90, 90d supply, fill #0
  Filled 2024-04-05: qty 90, 90d supply, fill #1
  Filled 2024-07-04: qty 90, 90d supply, fill #2

## 2024-01-06 MED ORDER — ONDANSETRON HCL 4 MG/2ML IJ SOLN
4.0000 mg | Freq: Once | INTRAMUSCULAR | Status: AC | PRN
  Administered 2024-01-06: 4 mg via INTRAVENOUS
  Filled 2024-01-06: qty 2

## 2024-01-06 NOTE — ED Triage Notes (Signed)
 Patient reports emesis for the past few hours. States she has thrown up 5 times. Denies abdominal pain. Or pain anywhere. No blood thinners. Husband reports she had pasta with salmon, new recipe but she has had it before. Everyone at home ate the same thing, but no one else is having nausea.

## 2024-01-07 ENCOUNTER — Other Ambulatory Visit: Payer: Self-pay

## 2024-01-07 MED ORDER — ONDANSETRON 8 MG PO TBDP: ORAL_TABLET | ORAL | 0 refills | Status: AC

## 2024-01-07 NOTE — ED Provider Notes (Signed)
 [place] EMERGENCY DEPARTMENT AT [facility] Provider Note   CSN: [id number] Arrival date & time: 01/06/24  2231     History  Chief Complaint  Patient presents with   Emesis    Rachel Santiago is a 63 y.o. female.  Patient is a 63 year old female with past medical history of hypertension, type 2 diabetes, asthma, hyperlipidemia, non-Hodgkin's lymphoma.  Patient presenting today for evaluation of nausea and vomiting.  This came on suddenly this evening.  She reports 6 episodes of nausea and vomiting that occurred in succession.  There was no associated diarrhea, abdominal pain, fever.  She denies any ill contacts or having consumed any undercooked or suspicious foods.  She was given Zofran  in triage and nausea is much improved.       Home Medications Prior to Admission medications   Medication Sig Start Date End Date Taking? Authorizing Provider  azithromycin  (ZITHROMAX ) 500 MG tablet Take 1 tablet (500 mg total) by mouth daily. 09/20/22   Watson Hacking, MD  benazepril  (LOTENSIN ) 10 MG tablet Take 1 tablet (10 mg total) by mouth daily. 01/13/23     Calcium Citrate 1040 MG TABS     [provider]  Dulaglutide  (TRULICITY ) 3 MG/0.5ML SOAJ Inject 3 mg into the skin every 7 (seven) days. 04/23/23     Dulaglutide  0.75 MG/0.5ML SOPN Inject into the skin.    [provider]  Empagliflozin -metFORMIN  HCl (SYNJARDY ) 12-998 MG TABS Take 1 tablet by mouth 2 (two) times daily. 01/13/23     fenofibrate  160 MG tablet Take 1 tablet (160 mg total) by mouth daily. 01/06/24     fenofibrate  micronized (LOFIBRA) 134 MG capsule Take 1 capsule by mouth daily. 03/14/18   [provider]  glucose blood (ONETOUCH VERIO) test strip Use to check blood glucose three times daily. 12/17/22     icosapent  Ethyl (VASCEPA ) 1 g capsule Take 2 capsules (2 g total) by mouth 2 (two) times daily with a meal. 07/22/23     insulin  glargine, 1 Unit Dial , (TOUJEO  SOLOSTAR) 300 UNIT/ML Solostar Pen  Inject 125 Units into the skin daily. 12/15/23     insulin  glargine, 1 Unit Dial , (TOUJEO  SOLOSTAR) 300 UNIT/ML Solostar Pen Inject 125 Units into the skin daily. 12/16/23     Insulin  Pen Needle (TECHLITE PEN NEEDLES) 32G X 4 MM MISC Use to inject insulin  once daily 12/17/22     levalbuterol  (XOPENEX  HFA) 45 MCG/ACT inhaler Inhale 2 puffs into the lungs every 4 (four) hours as needed for wheezing. Patient not taking: Reported on 09/20/2022 09/21/13   Watson Hacking, MD  levalbuterol  (XOPENEX  HFA) 45 MCG/ACT inhaler Inhale 1 puff into the lungs 3 (three) times daily as needed. 12/16/23     montelukast  (SINGULAIR ) 10 MG tablet Take 1 tablet (10 mg total) by mouth daily. 09/03/23     Multiple Vitamins-Minerals (MULTIVITAMIN WITH MINERALS) tablet Take 1 tablet by mouth daily.    [provider]  omega-3 fish oil (MAXEPA) 1000 MG CAPS capsule Take 2 capsules by mouth.    [provider]  simvastatin  (ZOCOR ) 40 MG tablet Take 1 tablet (40 mg total) by mouth daily. 01/06/24         Allergies    Penicillins and Amoxicillin    Review of Systems   Review of Systems  All other systems reviewed and are negative.   Physical Exam Updated Vital Signs BP (!) 120/98 (BP Location: Right Arm)   Pulse (!) 104   Temp  97.6 F (36.4 C)   Resp 19   Ht 5\' 5"  (1.651 m)   Wt 77.1 kg   SpO2 92%   BMI 28.29 kg/m  Physical Exam Vitals and nursing note reviewed.  Constitutional:      General: She is not in acute distress.    Appearance: She is well-developed. She is not diaphoretic.  HENT:     Head: Normocephalic and atraumatic.     Mouth/Throat:     Mouth: Mucous membranes are moist.     Pharynx: No oropharyngeal exudate or posterior oropharyngeal erythema.  Cardiovascular:     Rate and Rhythm: Normal rate and regular rhythm.     Heart sounds: No murmur heard.    No friction rub. No gallop.  Pulmonary:     Effort: Pulmonary effort is normal. No respiratory distress.     Breath sounds:  Normal breath sounds. No wheezing.  Abdominal:     General: Bowel sounds are normal. There is no distension.     Palpations: Abdomen is soft.     Tenderness: There is no abdominal tenderness.  Musculoskeletal:        General: Normal range of motion.     Cervical back: Normal range of motion and neck supple.  Skin:    General: Skin is warm and dry.  Neurological:     General: No focal deficit present.     Mental Status: She is alert and oriented to person, place, and time.     ED Results / Procedures / Treatments   Labs (all labs ordered are listed, but only abnormal results are displayed) Labs Reviewed  LIPASE, BLOOD - Abnormal; Notable for the following components:      Result Value   Lipase 84 (*)    All other components within normal limits  COMPREHENSIVE METABOLIC PANEL WITH GFR - Abnormal; Notable for the following components:   Potassium 3.4 (*)    CO2 21 (*)    Glucose, Bld 150 (*)    BUN 25 (*)    Creatinine, Ser 1.10 (*)    Calcium 10.7 (*)    AST 65 (*)    ALT 54 (*)    GFR, Estimated 57 (*)    Anion gap 18 (*)    All other components within normal limits  CBC  URINALYSIS, ROUTINE W REFLEX MICROSCOPIC    EKG None  Radiology No results found.  Procedures Procedures    Medications Ordered in ED Medications  ondansetron  (ZOFRAN ) injection 4 mg (4 mg Intravenous Given 01/06/24 2302)    ED Course/ Medical Decision Making/ A&P  Patient is a 63 year old female presenting with complaints of nausea and vomiting as described in the HPI.  She reports 6 episodes of this this evening, but has improved after receiving Zofran .  She denies any abdominal pain.  Physical examination is unremarkable and abdomen is benign.  Laboratory studies obtained include CBC, CMP, and lipase.  Patient has no leukocytosis, no significant electrolyte derangement, but does have trivial elevations of her transaminases and lipase.  Her LFT elevations are chronic in nature.  Patient  given IV Zofran  in triage and symptoms have completely resolved.  She is tolerating p.o. without difficulty.  She has a benign abdomen with no tenderness.  She does have a trivial elevation of her lipase, but I suspect this is related to vomiting and not pancreatitis given the fact that she is not having any pain.  I feel as though patient can safely be discharged with  Zofran  and as needed return.  Symptoms most likely related to either foodborne illness or viral infection.  Final Clinical Impression(s) / ED Diagnoses Final diagnoses:  None    Rx / DC Orders ED Discharge Orders     None         Orvilla Blander, MD 01/07/24 0221

## 2024-01-07 NOTE — Discharge Instructions (Signed)
 Begin taking Zofran as prescribed as needed for nausea.  Clear liquids for the next 12 hours, then slowly advance diet as tolerated.  Return to the ER if you develop severe abdominal pain, high fevers, bloody stools, or for other new and concerning symptoms.

## 2024-01-14 ENCOUNTER — Other Ambulatory Visit (HOSPITAL_BASED_OUTPATIENT_CLINIC_OR_DEPARTMENT_OTHER): Payer: Self-pay

## 2024-01-23 ENCOUNTER — Other Ambulatory Visit: Payer: Self-pay

## 2024-01-23 ENCOUNTER — Other Ambulatory Visit (HOSPITAL_BASED_OUTPATIENT_CLINIC_OR_DEPARTMENT_OTHER): Payer: Self-pay

## 2024-01-24 ENCOUNTER — Other Ambulatory Visit (HOSPITAL_BASED_OUTPATIENT_CLINIC_OR_DEPARTMENT_OTHER): Payer: Self-pay

## 2024-02-18 ENCOUNTER — Other Ambulatory Visit (HOSPITAL_BASED_OUTPATIENT_CLINIC_OR_DEPARTMENT_OTHER): Payer: Self-pay

## 2024-02-19 ENCOUNTER — Other Ambulatory Visit: Payer: Self-pay

## 2024-02-19 ENCOUNTER — Other Ambulatory Visit (HOSPITAL_BASED_OUTPATIENT_CLINIC_OR_DEPARTMENT_OTHER): Payer: Self-pay

## 2024-02-19 MED ORDER — TECHLITE PEN NEEDLES 32G X 4 MM MISC
0 refills | Status: AC
  Filled 2024-02-19 – 2024-02-24 (×3): qty 100, 90d supply, fill #0

## 2024-02-20 ENCOUNTER — Other Ambulatory Visit (HOSPITAL_BASED_OUTPATIENT_CLINIC_OR_DEPARTMENT_OTHER): Payer: Self-pay

## 2024-02-24 ENCOUNTER — Other Ambulatory Visit (HOSPITAL_BASED_OUTPATIENT_CLINIC_OR_DEPARTMENT_OTHER): Payer: Self-pay

## 2024-03-10 ENCOUNTER — Other Ambulatory Visit (HOSPITAL_BASED_OUTPATIENT_CLINIC_OR_DEPARTMENT_OTHER): Payer: Self-pay

## 2024-03-11 ENCOUNTER — Other Ambulatory Visit (HOSPITAL_BASED_OUTPATIENT_CLINIC_OR_DEPARTMENT_OTHER): Payer: Self-pay

## 2024-03-11 ENCOUNTER — Other Ambulatory Visit: Payer: Self-pay

## 2024-03-11 MED ORDER — BENAZEPRIL HCL 10 MG PO TABS
10.0000 mg | ORAL_TABLET | Freq: Every day | ORAL | 4 refills | Status: AC
  Filled 2024-03-11: qty 90, 90d supply, fill #0
  Filled 2024-06-01: qty 90, 90d supply, fill #1
  Filled 2024-08-25: qty 90, 90d supply, fill #2

## 2024-03-11 MED ORDER — TRULICITY 3 MG/0.5ML ~~LOC~~ SOAJ
3.0000 mg | SUBCUTANEOUS | 3 refills | Status: AC
  Filled 2024-03-11: qty 6, 84d supply, fill #0
  Filled 2024-06-01: qty 6, 84d supply, fill #1
  Filled 2024-08-25: qty 6, 84d supply, fill #2

## 2024-03-19 ENCOUNTER — Other Ambulatory Visit (HOSPITAL_BASED_OUTPATIENT_CLINIC_OR_DEPARTMENT_OTHER): Payer: Self-pay

## 2024-03-19 MED ORDER — TRIAMCINOLONE ACETONIDE 0.1 % EX CREA
1.0000 | TOPICAL_CREAM | Freq: Two times a day (BID) | CUTANEOUS | 0 refills | Status: AC
  Filled 2024-03-19: qty 454, 90d supply, fill #0

## 2024-04-09 ENCOUNTER — Other Ambulatory Visit (HOSPITAL_BASED_OUTPATIENT_CLINIC_OR_DEPARTMENT_OTHER): Payer: Self-pay

## 2024-04-10 ENCOUNTER — Other Ambulatory Visit (HOSPITAL_BASED_OUTPATIENT_CLINIC_OR_DEPARTMENT_OTHER): Payer: Self-pay

## 2024-04-10 ENCOUNTER — Other Ambulatory Visit: Payer: Self-pay

## 2024-04-10 MED ORDER — SYNJARDY 5-1000 MG PO TABS
1.0000 | ORAL_TABLET | Freq: Two times a day (BID) | ORAL | 4 refills | Status: AC
  Filled 2024-04-10: qty 180, 90d supply, fill #0
  Filled 2024-07-04: qty 180, 90d supply, fill #1

## 2024-04-13 ENCOUNTER — Other Ambulatory Visit: Payer: Self-pay

## 2024-04-15 ENCOUNTER — Other Ambulatory Visit (HOSPITAL_BASED_OUTPATIENT_CLINIC_OR_DEPARTMENT_OTHER): Payer: Self-pay

## 2024-04-17 ENCOUNTER — Other Ambulatory Visit (HOSPITAL_BASED_OUTPATIENT_CLINIC_OR_DEPARTMENT_OTHER): Payer: Self-pay

## 2024-04-18 ENCOUNTER — Other Ambulatory Visit (HOSPITAL_BASED_OUTPATIENT_CLINIC_OR_DEPARTMENT_OTHER): Payer: Self-pay

## 2024-04-22 ENCOUNTER — Other Ambulatory Visit (HOSPITAL_BASED_OUTPATIENT_CLINIC_OR_DEPARTMENT_OTHER): Payer: Self-pay

## 2024-05-16 ENCOUNTER — Other Ambulatory Visit (HOSPITAL_BASED_OUTPATIENT_CLINIC_OR_DEPARTMENT_OTHER): Payer: Self-pay

## 2024-05-18 ENCOUNTER — Other Ambulatory Visit (HOSPITAL_BASED_OUTPATIENT_CLINIC_OR_DEPARTMENT_OTHER): Payer: Self-pay

## 2024-05-18 MED ORDER — DOXYCYCLINE HYCLATE 100 MG PO CAPS
100.0000 mg | ORAL_CAPSULE | Freq: Two times a day (BID) | ORAL | 0 refills | Status: AC
  Filled 2024-05-18: qty 10, 5d supply, fill #0

## 2024-06-01 ENCOUNTER — Other Ambulatory Visit: Payer: Self-pay

## 2024-06-01 ENCOUNTER — Other Ambulatory Visit (HOSPITAL_BASED_OUTPATIENT_CLINIC_OR_DEPARTMENT_OTHER): Payer: Self-pay

## 2024-07-01 ENCOUNTER — Other Ambulatory Visit (HOSPITAL_BASED_OUTPATIENT_CLINIC_OR_DEPARTMENT_OTHER): Payer: Self-pay

## 2024-07-01 MED ORDER — ICOSAPENT ETHYL 1 G PO CAPS
2.0000 g | ORAL_CAPSULE | Freq: Two times a day (BID) | ORAL | 5 refills | Status: AC
Start: 2024-07-01 — End: ?
  Filled 2024-07-01: qty 120, 30d supply, fill #0

## 2024-07-02 ENCOUNTER — Other Ambulatory Visit (HOSPITAL_BASED_OUTPATIENT_CLINIC_OR_DEPARTMENT_OTHER): Payer: Self-pay

## 2024-07-06 ENCOUNTER — Other Ambulatory Visit (HOSPITAL_BASED_OUTPATIENT_CLINIC_OR_DEPARTMENT_OTHER): Payer: Self-pay

## 2024-08-25 ENCOUNTER — Other Ambulatory Visit (HOSPITAL_BASED_OUTPATIENT_CLINIC_OR_DEPARTMENT_OTHER): Payer: Self-pay

## 2024-08-26 ENCOUNTER — Other Ambulatory Visit: Payer: Self-pay

## 2024-08-26 ENCOUNTER — Other Ambulatory Visit (HOSPITAL_BASED_OUTPATIENT_CLINIC_OR_DEPARTMENT_OTHER): Payer: Self-pay

## 2024-08-26 MED ORDER — MONTELUKAST SODIUM 10 MG PO TABS
10.0000 mg | ORAL_TABLET | Freq: Every day | ORAL | 3 refills | Status: AC
  Filled 2024-08-26: qty 90, 90d supply, fill #0

## 2024-09-25 ENCOUNTER — Other Ambulatory Visit (HOSPITAL_BASED_OUTPATIENT_CLINIC_OR_DEPARTMENT_OTHER): Payer: Self-pay

## 2024-09-25 ENCOUNTER — Other Ambulatory Visit: Payer: Self-pay | Admitting: Endocrinology

## 2024-09-25 DIAGNOSIS — Z1231 Encounter for screening mammogram for malignant neoplasm of breast: Secondary | ICD-10-CM

## 2024-09-25 MED ORDER — LEVOTHYROXINE SODIUM 100 MCG PO TABS
100.0000 ug | ORAL_TABLET | Freq: Every morning | ORAL | 11 refills | Status: AC
  Filled 2024-09-25: qty 30, 30d supply, fill #0

## 2024-09-25 MED ORDER — ICOSAPENT ETHYL 1 G PO CAPS
2.0000 g | ORAL_CAPSULE | Freq: Two times a day (BID) | ORAL | 5 refills | Status: AC
  Filled 2024-09-25: qty 120, 30d supply, fill #0

## 2024-09-30 ENCOUNTER — Ambulatory Visit
